# Patient Record
Sex: Female | Born: 1970 | Race: White | Hispanic: No | Marital: Married | Smoking: Former smoker
Health system: Southern US, Community
[De-identification: ages and names within clinical notes are randomized; demographics above are authoritative.]

## PROBLEM LIST (undated history)

## (undated) DIAGNOSIS — T7840XA Allergy, unspecified, initial encounter: Secondary | ICD-10-CM

## (undated) DIAGNOSIS — M1990 Unspecified osteoarthritis, unspecified site: Secondary | ICD-10-CM

## (undated) DIAGNOSIS — F419 Anxiety disorder, unspecified: Secondary | ICD-10-CM

## (undated) DIAGNOSIS — I10 Essential (primary) hypertension: Secondary | ICD-10-CM

## (undated) DIAGNOSIS — K219 Gastro-esophageal reflux disease without esophagitis: Secondary | ICD-10-CM

## (undated) HISTORY — PX: CHOLECYSTECTOMY: SHX55

## (undated) HISTORY — PX: EYE SURGERY: SHX253

## (undated) HISTORY — DX: Essential (primary) hypertension: I10

## (undated) HISTORY — DX: Allergy, unspecified, initial encounter: T78.40XA

## (undated) HISTORY — DX: Unspecified osteoarthritis, unspecified site: M19.90

## (undated) HISTORY — DX: Gastro-esophageal reflux disease without esophagitis: K21.9

## (undated) HISTORY — DX: Anxiety disorder, unspecified: F41.9

## (undated) HISTORY — PX: ABDOMINAL HYSTERECTOMY: SHX81

---

## 2013-12-20 DIAGNOSIS — K219 Gastro-esophageal reflux disease without esophagitis: Secondary | ICD-10-CM | POA: Insufficient documentation

## 2014-02-21 DIAGNOSIS — E782 Mixed hyperlipidemia: Secondary | ICD-10-CM | POA: Insufficient documentation

## 2015-05-08 ENCOUNTER — Encounter: Payer: Self-pay | Admitting: Podiatry

## 2015-05-08 ENCOUNTER — Ambulatory Visit (INDEPENDENT_AMBULATORY_CARE_PROVIDER_SITE_OTHER): Payer: BC Managed Care – PPO | Admitting: Podiatry

## 2015-05-08 ENCOUNTER — Ambulatory Visit (INDEPENDENT_AMBULATORY_CARE_PROVIDER_SITE_OTHER): Payer: BC Managed Care – PPO

## 2015-05-08 VITALS — BP 119/78 | HR 71 | Resp 18

## 2015-05-08 DIAGNOSIS — R52 Pain, unspecified: Secondary | ICD-10-CM | POA: Diagnosis not present

## 2015-05-08 DIAGNOSIS — D3610 Benign neoplasm of peripheral nerves and autonomic nervous system, unspecified: Secondary | ICD-10-CM | POA: Diagnosis not present

## 2015-05-08 NOTE — Progress Notes (Signed)
   Subjective:    Patient ID: Janice Gutierrez, female    DOB: [birth year], 45 y.o.   MRN: [id number]  HPI  45 year old female presents the office they for concerns of pain in between her third and fourth toes on the left foot. She also gets some numbness into into the third and fourth toes. His been ongoing for approximately 10 years it has been progressive. She denies any recent injury or trauma. She said no previous treatment. No other complaints at this time.   Review of Systems  All other systems reviewed and are negative.      Objective:   Physical Exam General: AAO x3, NAD  Dermatological: Skin is warm, dry and supple bilateral. Nails x 10 are well manicured; remaining integument appears unremarkable at this time. There are no open sores, no preulcerative lesions, no rash or signs of infection present.  Vascular: Dorsalis Pedis artery and Posterior Tibial artery pedal pulses are 2/4 bilateral with immedate capillary fill time. Pedal hair growth present. No varicosities and no lower extremity edema present bilateral. There is no pain with calf compression, swelling, warmth, erythema.   Neruologic: Grossly intact via light touch bilateral. Vibratory intact via tuning fork bilateral. Protective threshold with Semmes Wienstein monofilament intact to all pedal sites bilateral. Patellar and Achilles deep tendon reflexes 2+ bilateral. No Babinski or clonus noted bilateral.   Musculoskeletal: Tenderness palpation imaging the third and fourth metatarsal heads on the left foot. There is no palpable neuroma identified this time. There is no area pinpoint bony tenderness or pain the vibratory sensation. No clicking sensation. MMT 5/5.  Gait: Unassisted, Nonantalgic.      Assessment & Plan:  45 year old female left third interspace likely neuroma versus bursitis  -Treatment options discussed including all alternatives, risks, and complications -Etiology of symptoms were discussed -X-rays were  obtained and reviewed with the patient. No evidence of acute fracture or stress fracture.  -After discussion the patient she was to proceed with a steroid injection. Under sterile conditions a mixture of Kenalog, local anesthesia was infiltrated into the area of maximal tenderness. Postinjection care discussed  -Offloading pads dispensed  -Discussed ultrasound the future if symptoms persist.  -Follow-up 4 weeks.   Celesta Gentile, DPM

## 2015-05-12 ENCOUNTER — Telehealth: Payer: Self-pay | Admitting: Podiatry

## 2015-05-12 DIAGNOSIS — D3610 Benign neoplasm of peripheral nerves and autonomic nervous system, unspecified: Secondary | ICD-10-CM

## 2015-05-12 NOTE — Telephone Encounter (Signed)
Please order ultrasound r/o neuroma

## 2015-05-12 NOTE — Telephone Encounter (Signed)
Patient was seen by Dr. Jacqualyn Posey on 05/08/15 received an injection in left foot. She said the injection has helped but she still feels a knot in the bottom of her foot and wants to go ahead and schedule the ultrasound. Please call her to schedule that at [phone] Ext. [phone] before 3:00 today. IF after 3:00 call her at [phone].

## 2015-05-12 NOTE — Telephone Encounter (Signed)
Pt states still has knot in foot, a little better, but would like to schedule [place].

## 2015-05-13 NOTE — Addendum Note (Signed)
Addended by: Rip Harbour on: 05/13/2015 09:34 AM   Modules accepted: Orders

## 2015-05-13 NOTE — Telephone Encounter (Signed)
Patient scheduled for ultrasound at [facility] for April 13th at 3:00. Notified the patient and for her to arrive at 2:45. Also contacted [facility] and no authorization or prior approval is required.

## 2015-05-15 ENCOUNTER — Ambulatory Visit
Admission: RE | Admit: 2015-05-15 | Discharge: 2015-05-15 | Disposition: A | Discharge status: Home / Self Care | Payer: BC Managed Care – PPO | Source: Ambulatory Visit | Attending: Podiatry | Admitting: Podiatry

## 2015-05-15 DIAGNOSIS — Z0389 Encounter for observation for other suspected diseases and conditions ruled out: Secondary | ICD-10-CM | POA: Insufficient documentation

## 2015-05-15 DIAGNOSIS — D3610 Benign neoplasm of peripheral nerves and autonomic nervous system, unspecified: Secondary | ICD-10-CM

## 2015-05-19 ENCOUNTER — Telehealth: Payer: Self-pay | Admitting: *Deleted

## 2015-05-19 NOTE — Telephone Encounter (Addendum)
-----   Message from Trula Slade, DPM sent at 05/18/2015  6:50 PM EDT ----- Please let her know that her ultrasound was negative for neuroma or bursa. Please have her come in for continued treatment if she is still having pain. 05/19/2015-Unable to contact pt the mobile service was busy.  Unable to speak to pt, the phone answered, but there was sound as if someone was on the line, so I left a message to call for results.  Pt called again for results.  I informed pt of Dr. Leigh Aurora results and orders, and transferred pt to schedulers.

## 2015-06-10 ENCOUNTER — Encounter: Payer: Self-pay | Admitting: Podiatry

## 2015-06-10 ENCOUNTER — Ambulatory Visit (INDEPENDENT_AMBULATORY_CARE_PROVIDER_SITE_OTHER): Payer: BC Managed Care – PPO | Admitting: Podiatry

## 2015-06-10 VITALS — BP 117/77 | HR 73 | Resp 18

## 2015-06-10 DIAGNOSIS — M779 Enthesopathy, unspecified: Secondary | ICD-10-CM

## 2015-06-10 DIAGNOSIS — M216X2 Other acquired deformities of left foot: Secondary | ICD-10-CM

## 2015-06-10 DIAGNOSIS — M7742 Metatarsalgia, left foot: Secondary | ICD-10-CM

## 2015-06-10 MED ORDER — MELOXICAM 15 MG PO TABS: 15.0000 mg | ORAL_TABLET | Freq: Every day | ORAL | Status: DC

## 2015-06-10 NOTE — Progress Notes (Signed)
Patient ID: Janice Gutierrez, female   DOB: [birth year], 45 y.o.   MRN: [id number]  Subjective: 45 year old female presents the office to discuss further treatment for her left foot pain. She states the injection did help for couple days after the last appointment have the pain did recur. She also ultrasound previously which was negative for neuroma. She gets occasional numbness to her third fourth toes however not consistently however she does get some discomfort of the bottom of her foot and the ball area. He still has some swelling that she has noticed in the ball of her left foot as well for which she points to submetatarsal 4. No erythema or increase in warmth. Denies any systemic complaints such as fevers, chills, nausea, vomiting. No acute changes since last appointment, and no other complaints at this time.   Objective: AAO x3, NAD DP/PT pulses palpable bilaterally, CRT less than 3 seconds Protective sensation intact with Simms Weinstein monofilament, ROM is the metatarsal heads plantarly with atrophy of the fat pad particular the fourth metatarsal head with localized edema submetatarsal 4. There is no assisted erythema or increase in warmth. Mild tenderness overlying this area. There is no tenderness directly in the interspace at this time. There is no pain with MTPJ range of motion. No pain vibratory sensation. No other areas of tenderness bilaterally. No areas of pinpoint bony tenderness or pain with vibratory sensation. MMT 5/5, ROM WNL. No edema, erythema, increase in warmth to bilateral lower extremities.  No open lesions or pre-ulcerative lesions.  No pain with calf compression, swelling, warmth, erythema  Assessment: Tenderness likely due to metatarsalgia and prominent metatarsal heads and capsulitis.  Plan: -All treatment options discussed with the patient including all alternatives, risks, complications.  -Discussed both conservative and surgical treatment options. I also reviewed the  ultrasound from last appointment which was negative. -I recommended orthotics with an offloading pad around the symptomatic area. She wishes to hold off on CMO and she'll get over-the-counter and dispensed offloading pads. -Prescribed mobic. Discussed side effects of the medication and directed to stop if any are to occur and call the office.  -Ice -Shoegear modifications -Follow-up in 4-6 weeks if symptoms continue or sooner if any problems arise. In the meantime, encouraged to call the office with any questions, concerns, change in symptoms.   Celesta Gentile, DPM

## 2016-08-27 DIAGNOSIS — Z6833 Body mass index (BMI) 33.0-33.9, adult: Secondary | ICD-10-CM | POA: Insufficient documentation

## 2017-05-11 IMAGING — US US EXTREM LOW*L* LIMITED
1 series · 14 of 15 positions shown · non-contrast
Comparison: None

CLINICAL DATA: Neuroma versus bursitis at plantar aspect of LEFT
foot at third interspace

EXAM:
ULTRASOUND RIGHT LOWER EXTREMITY LIMITED
TECHNIQUE: Ultrasound examination of the lower extremity soft tissues was
performed in the area of clinical concern.

[Series 1: us extrem low*left* limited · 0.07mm/px · 14 of 15 slices shown]
[im 1/15]
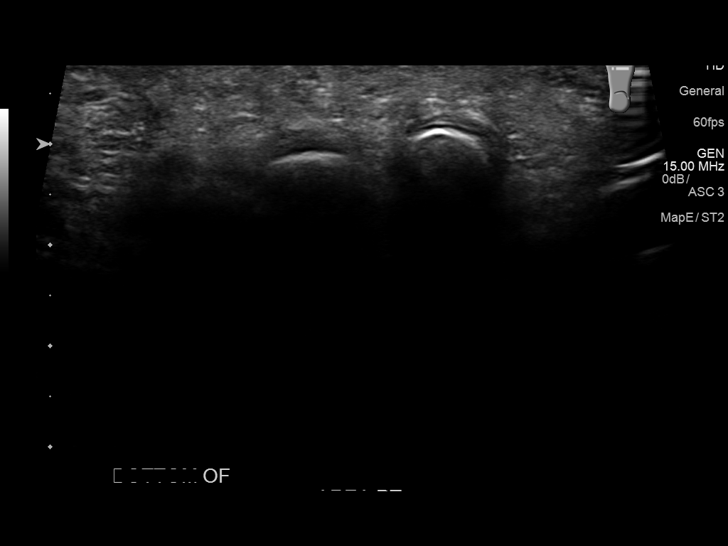
[im 2/15]
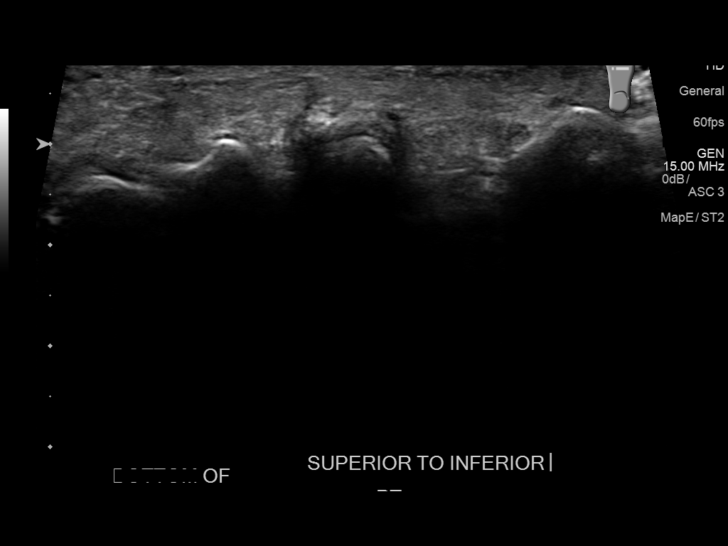
[im 3/15]
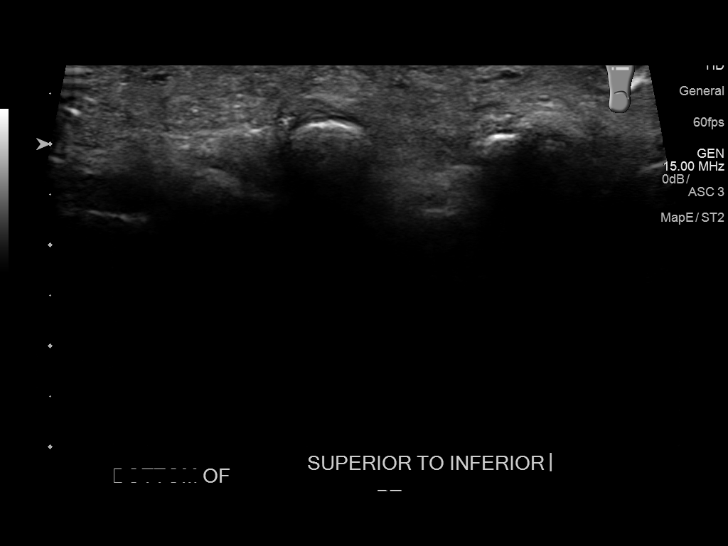
[im 4/15]
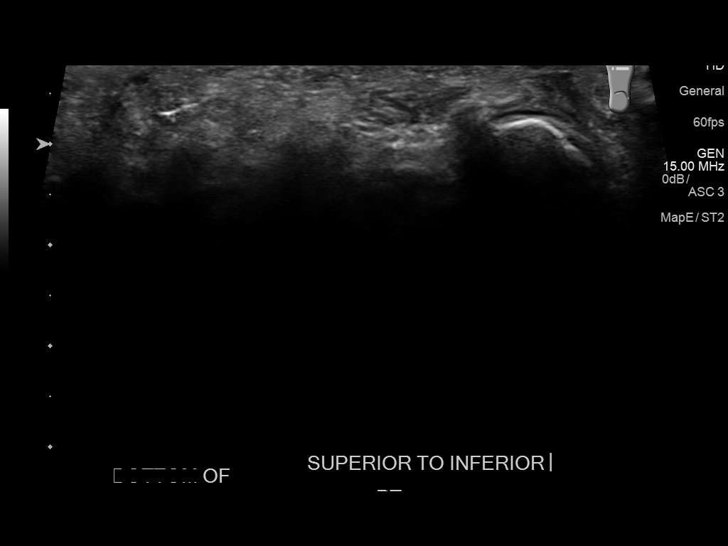
[im 5/15]
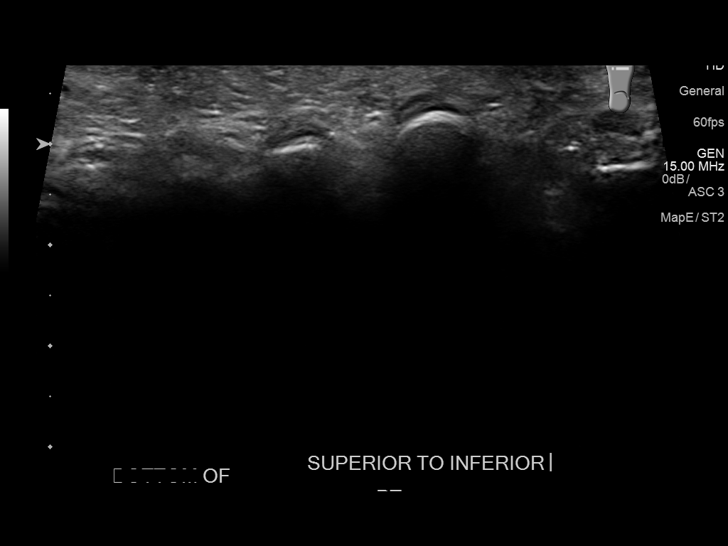
[im 6/15]
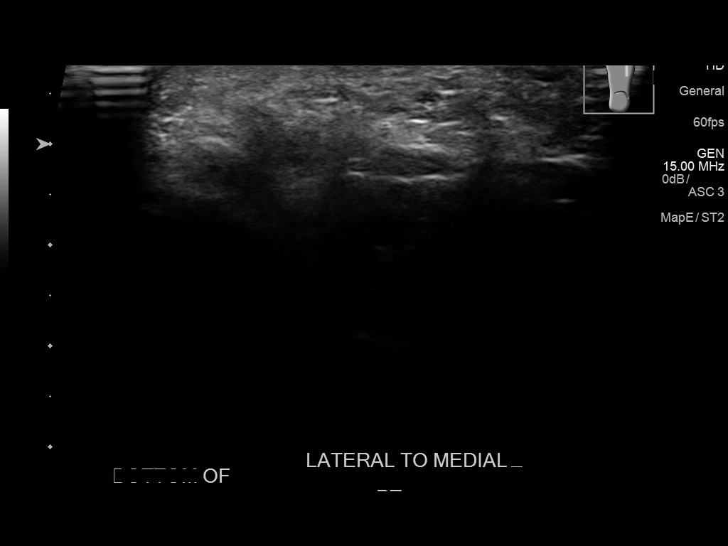
[im 7/15]
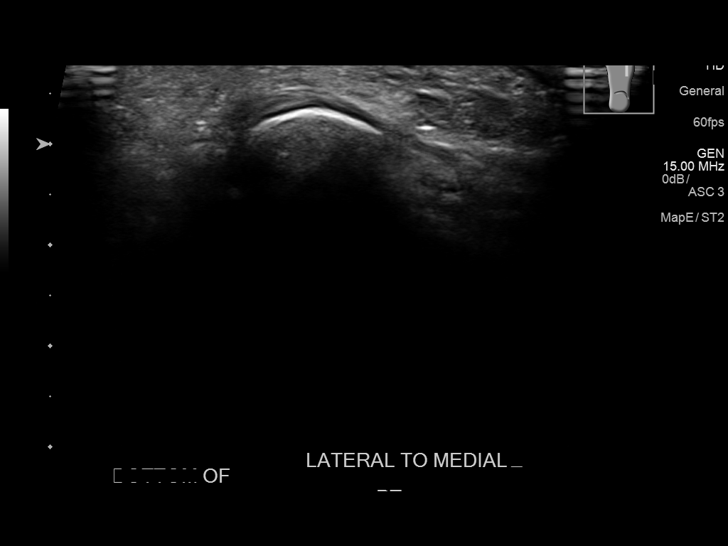
[im 9/15]
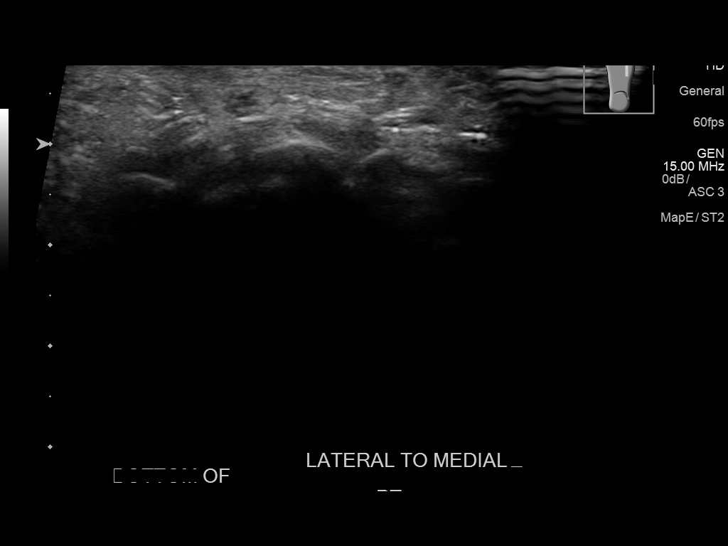
[im 10/15]
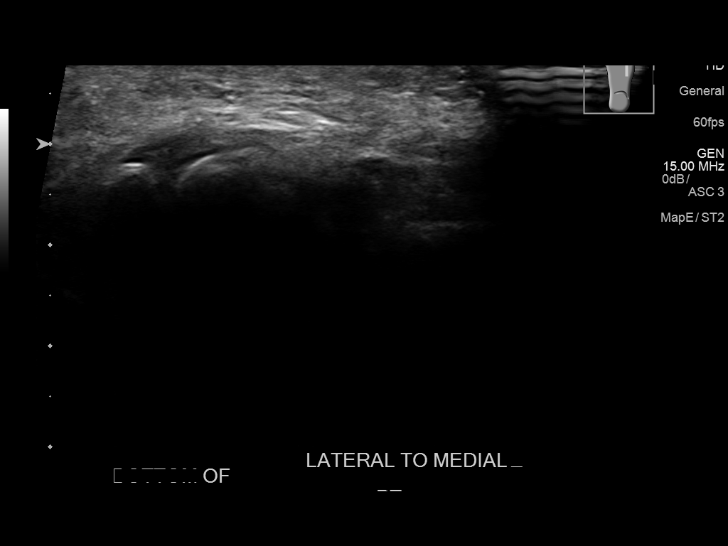
[im 11/15]
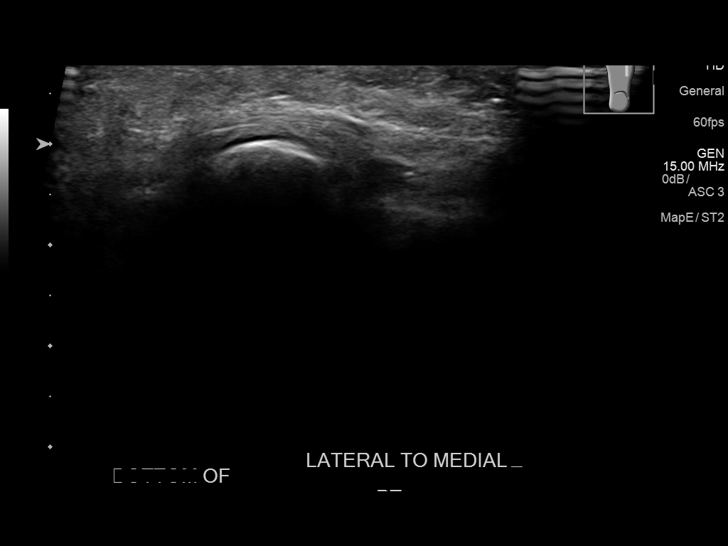
[im 12/15]
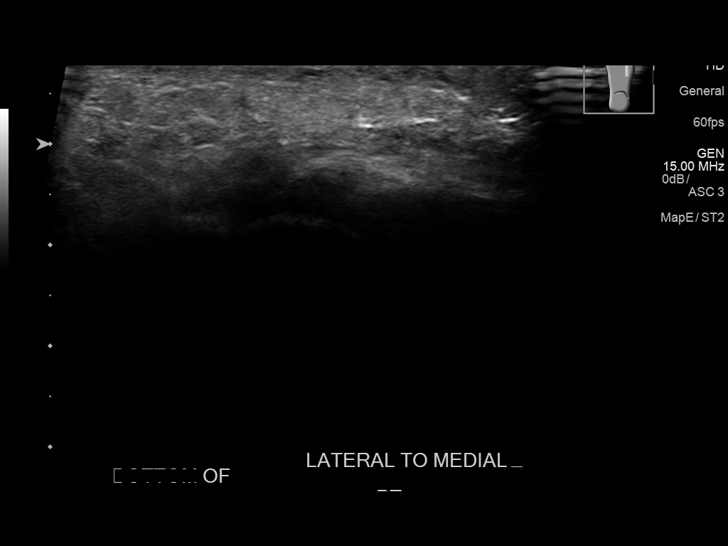
[im 13/15]
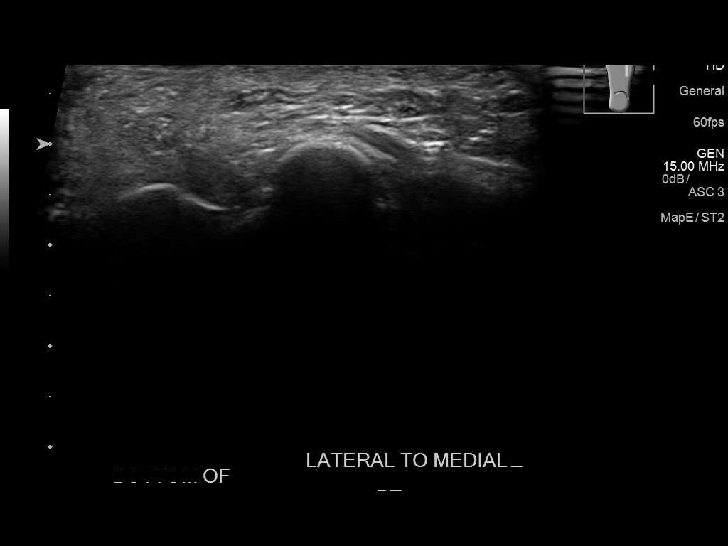
[im 14/15]
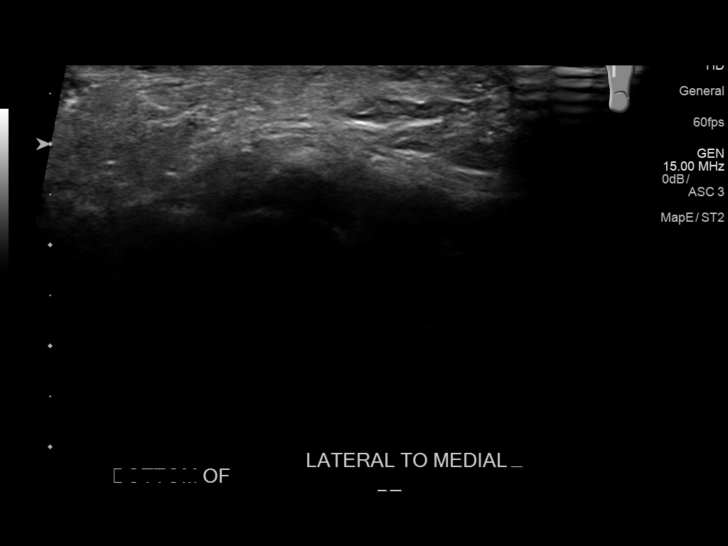
[im 15/15]
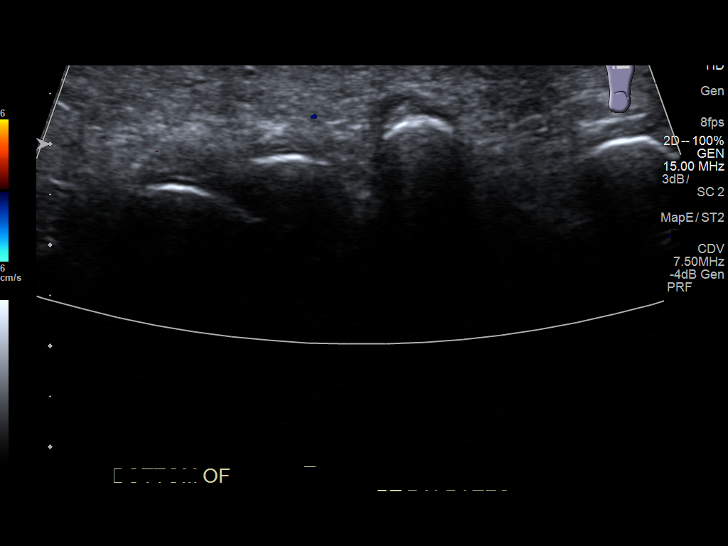

[14 of 15 positions shown; findings below may reference images not displayed]

FINDINGS: Sonography of the site of clinical concern at the plantar aspect of
the LEFT foot was performed.

No soft tissue mass or abnormal fluid collection identified.

No regional soft tissue changes, edema, or color Doppler
hypervascularity.
IMPRESSION: Negative ultrasound at site of clinical concern at the plantar
aspect of LEFT foot.

## 2017-07-08 ENCOUNTER — Ambulatory Visit: Payer: Self-pay | Admitting: Family Medicine

## 2017-08-30 ENCOUNTER — Ambulatory Visit: Payer: BC Managed Care – PPO | Admitting: Family Medicine

## 2017-08-30 ENCOUNTER — Encounter: Payer: Self-pay | Admitting: Family Medicine

## 2017-08-30 VITALS — BP 120/86 | HR 80 | Temp 98.5°F | Resp 16 | Ht 64.0 in | Wt 198.0 lb

## 2017-08-30 DIAGNOSIS — Z8249 Family history of ischemic heart disease and other diseases of the circulatory system: Secondary | ICD-10-CM | POA: Insufficient documentation

## 2017-08-30 DIAGNOSIS — E8881 Metabolic syndrome: Secondary | ICD-10-CM | POA: Diagnosis not present

## 2017-08-30 DIAGNOSIS — Z13220 Encounter for screening for lipoid disorders: Secondary | ICD-10-CM

## 2017-08-30 DIAGNOSIS — R7303 Prediabetes: Secondary | ICD-10-CM | POA: Insufficient documentation

## 2017-08-30 DIAGNOSIS — K2970 Gastritis, unspecified, without bleeding: Secondary | ICD-10-CM

## 2017-08-30 DIAGNOSIS — F419 Anxiety disorder, unspecified: Secondary | ICD-10-CM

## 2017-08-30 DIAGNOSIS — J309 Allergic rhinitis, unspecified: Secondary | ICD-10-CM | POA: Insufficient documentation

## 2017-08-30 DIAGNOSIS — E88819 Insulin resistance, unspecified: Secondary | ICD-10-CM

## 2017-08-30 DIAGNOSIS — M2560 Stiffness of unspecified joint, not elsewhere classified: Secondary | ICD-10-CM

## 2017-08-30 DIAGNOSIS — I10 Essential (primary) hypertension: Secondary | ICD-10-CM | POA: Diagnosis not present

## 2017-08-30 DIAGNOSIS — J301 Allergic rhinitis due to pollen: Secondary | ICD-10-CM | POA: Diagnosis not present

## 2017-08-30 NOTE — Patient Instructions (Signed)

## 2017-08-30 NOTE — Progress Notes (Signed)
Patient: Janice Gutierrez, Female    DOB: [birth year], 47 y.o.   MRN: [id number] Visit Date: 08/30/2017  Today's Provider: Lavon Paganini, MD   I, Martha Clan, CMA, am acting as scribe for Lavon Paganini, MD.  Chief Complaint  Patient presents with  . New Patient (Initial Visit)   Subjective:    New Patient Janice Gutierrez is a 47 y.o. female who presents today as a new patient to establish care. She feels well. She reports exercising some; walks, jump rope, etc. Would like to exercise more. She reports she is sleeping well.  Joints are stiff and sore in the mornings. Especially in hands and feet, and sometimes in hips.  Lasts for a few minutes.  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  She believes she may have some arthritis.  She denies joint pain today.  Patient was told about 10 years ago when she was living in [place] that her insulin level was elevated.  This persisted for about 5 to 7 years.  She was seen by endocrinology who told her this was insulin resistance, but not diabetes or prediabetes.  She states she has had her insulin level checked since moving to [place] and it was in the normal range now.  A1c has always been normal.  She does watch her carb intake and tries to exercise regularly.  HTN: - Medications: Metoprolol 100 mg daily, HCTZ 25 mg daily - Compliance: Good - Checking BP at home: No - Denies any SOB, CP, vision changes, LE edema, medication SEs, or symptoms of hypotension  Patient was diagnosed with gastritis on EGD at [facility] in 2015.  She states she was diagnosed with something else but cannot remember the name.  This is believed to be related to her NSAID use.  She has stopped using NSAIDs much at all and is feeling much better since.  She does take pantoprazole 20 mg daily for symptom relief.  Anxiety: Patient currently taking Lexapro 10 mg daily and  Wellbutrin 37.5 mg daily and she states she is previously on 75 mg of Wellbutrin, but cut back on this because she was feeling a little bit better.  States this is been a chronic issue for many years.  She does not see a psychiatrist or therapist.  She has not tried other medications.  Patient with family history of VTE.  Her father has had multiple blood clots.  Her uncle died of a pulmonary embolism after a knee surgery.  She worries about this and therefore takes a baby aspirin daily.  She has never been tested for clotting disorders and she does not know if her father has.  She has never had a VTE herself.  Patient is status post hysterectomy.  She knows that her ovaries were left, but she is unsure about her cervix.  She is not sure if she still needs to get Pap smears and not sure when her last Pap smear was.  She was seeing a gynecologist in [place].  Allergic rhinitis: Taking Zyrtec as needed, mostly seasonal.  Denies any symptoms currently -----------------------------------------------------------------   Review of Systems  Constitutional: Negative.   HENT: Positive for dental problem, mouth sores, rhinorrhea and sneezing. Negative for congestion, drooling, ear discharge, ear pain, facial swelling, hearing loss, nosebleeds, postnasal drip, sinus pressure, sinus pain, sore throat, tinnitus, trouble swallowing and voice change.  Eyes: Negative.   Respiratory: Negative.   Cardiovascular: Negative.   Gastrointestinal: Negative.   Endocrine: Positive for heat intolerance. Negative for cold intolerance, polydipsia, polyphagia and polyuria.  Genitourinary: Negative.   Musculoskeletal: Positive for back pain and neck stiffness. Negative for arthralgias, gait problem, joint swelling, myalgias and neck pain.  Skin: Positive for rash. Negative for color change, pallor and wound.  Allergic/Immunologic: Negative.   Neurological: Negative.   Hematological: Negative.   Psychiatric/Behavioral:  Negative.     Social History      She  reports that she has never smoked. She has never used smokeless tobacco. She reports that she does not drink alcohol or use drugs.       Social History   Socioeconomic History  . Marital status: Married    Spouse name: Not on file  . Number of children: Not on file  . Years of education: Not on file  . Highest education level: Not on file  Occupational History  . Not on file  Social Needs  . Financial resource strain: Not on file  . Food insecurity:    Worry: Not on file    Inability: Not on file  . Transportation needs:    Medical: Not on file    Non-medical: Not on file  Tobacco Use  . Smoking status: Never Smoker  . Smokeless tobacco: Never Used  Substance and Sexual Activity  . Alcohol use: No    Alcohol/week: 0.0 oz  . Drug use: No  . Sexual activity: Not on file  Lifestyle  . Physical activity:    Days per week: Not on file    Minutes per session: Not on file  . Stress: Not on file  Relationships  . Social connections:    Talks on phone: Not on file    Gets together: Not on file    Attends religious service: Not on file    Active member of club or organization: Not on file    Attends meetings of clubs or organizations: Not on file    Relationship status: Not on file  Other Topics Concern  . Not on file  Social History Narrative  . Not on file    Past Medical History:  Diagnosis Date  . Anxiety   . Arthritis   . Hypertension      Patient Active Problem List   Diagnosis Date Noted  . Prominent metatarsal head of left foot 06/10/2015  . Capsulitis 06/10/2015    Past Surgical History:  Procedure Laterality Date  . ABDOMINAL HYSTERECTOMY    . CHOLECYSTECTOMY    . EYE SURGERY      Family History        Family Status  Relation Name Status  . Mother  (Not Specified)  . Father  (Not Specified)  . Mat Aunt  (Not Specified)  . Mat Uncle  (Not Specified)  . Annamarie Major  (Not Specified)  . MGM  (Not  Specified)  . MGF  (Not Specified)        Her family history includes Bladder Cancer in her maternal grandfather; Clotting disorder in her father and paternal uncle; Diabetes in her maternal uncle; Hyperlipidemia in her maternal aunt; Hypertension in her mother; Skin cancer in her father and maternal grandmother.      Allergies  Allergen Reactions  . Latex      Current Outpatient Medications:  .  Ascorbic Acid (VITAMIN C) 1000 MG tablet, Take by mouth., Disp: , Rfl:  .  aspirin EC 81 MG tablet, Take 1 tablet by mouth daily., Disp: , Rfl:  .  buPROPion (WELLBUTRIN) 75 MG tablet, Take 0.5 tablets by mouth daily., Disp: , Rfl:  .  escitalopram (LEXAPRO) 10 MG tablet, Take 10 mg by mouth daily., Disp: , Rfl: 1 .  hydrochlorothiazide (HYDRODIURIL) 25 MG tablet, , Disp: , Rfl:  .  KRILL OIL PO, Take by mouth., Disp: , Rfl:  .  metoprolol succinate (TOPROL-XL) 100 MG 24 hr tablet, Take by mouth., Disp: , Rfl:  .  Multiple Vitamins-Minerals (MULTIVITAMIN WOMEN) TABS, Take by mouth., Disp: , Rfl:  .  OVER THE COUNTER MEDICATION, Metabolic Advantage Thyroid Health, Disp: , Rfl:  .  pantoprazole (PROTONIX) 20 MG tablet, Take 1 tablet by mouth daily., Disp: , Rfl:    Patient Care Team: Virginia Crews, MD as PCP - General (Family Medicine)      Objective:   Vitals: BP 120/86 (BP Location: Left Arm, Patient Position: Sitting, Cuff Size: Normal)   Pulse 80   Temp 98.5 F (36.9 C) (Oral)   Resp 16   Ht 5\' 4"  (1.626 m)   Wt 198 lb (89.8 kg)   SpO2 97%   BMI 33.99 kg/m    Vitals:   08/30/17 0910  BP: 120/86  Pulse: 80  Resp: 16  Temp: 98.5 F (36.9 C)  TempSrc: Oral  SpO2: 97%  Weight: 198 lb (89.8 kg)  Height: 5\' 4"  (1.626 m)     Physical Exam  Constitutional: She is oriented to person, place, and time. She appears well-developed and well-nourished. No distress.  HENT:  Head: Normocephalic and atraumatic.  Right Ear: External ear normal.  Left Ear: External ear normal.   Nose: Nose normal.  Mouth/Throat: Oropharynx is clear and moist.  Eyes: Pupils are equal, round, and reactive to light. Conjunctivae and EOM are normal. Right eye exhibits no discharge. Left eye exhibits no discharge. No scleral icterus.  Neck: Neck supple. No thyromegaly present.  Cardiovascular: Normal rate, regular rhythm, normal heart sounds and intact distal pulses.  No murmur heard. Pulmonary/Chest: Effort normal and breath sounds normal. No respiratory distress. She has no wheezes. She has no rales.  Abdominal: Soft. Bowel sounds are normal. She exhibits no distension. There is no tenderness. There is no rebound and no guarding.  Musculoskeletal: She exhibits no edema or deformity.  Lymphadenopathy:    She has no cervical adenopathy.  Neurological: She is alert and oriented to person, place, and time. She has normal strength. She displays no atrophy. No cranial nerve deficit or sensory deficit. She exhibits normal muscle tone. Gait normal.  Skin: Skin is warm and dry. Capillary refill takes less than 2 seconds. No rash noted.  Psychiatric: She has a normal mood and affect. Her behavior is normal. Thought content normal.  Vitals reviewed.    Depression Screen PHQ 2/9 Scores 08/30/2017  PHQ - 2 Score 0     Assessment & Plan:    Problem List Items Addressed This Visit      Cardiovascular and Mediastinum   Essential hypertension - Primary    Well-controlled Continue HCTZ and metoprolol at current doses Check CMP Follow-up in 6 months      Relevant Medications   aspirin EC 81 MG tablet   Other Relevant Orders   Comprehensive metabolic panel (Completed)     Respiratory   Allergic rhinitis    Well-controlled Continue Zyrtec as needed        Digestive   Gastritis  Asymptomatic and improving Avoid NSAIDs as she has been Continue PPI therapy We will try to get pathology records from [facility]        Endocrine   Insulin resistance    Advised patient on low-carb  diet to prevent progression to prediabetes or diabetes We will screen at least blood glucose annually No indication to continue checking insulin levels, however      Relevant Orders   Comprehensive metabolic panel (Completed)     Other   Anxiety    Well-controlled at this time Continue Wellbutrin and Lexapro at current doses Follow-up in 6 months Repeat PHQ 9 and GAD 7 at next visit      Relevant Medications   buPROPion (WELLBUTRIN) 75 MG tablet   Other Relevant Orders   CBC (Completed)   Family history of blood clots    Okay to continue baby aspirin daily Discussed signs and symptoms of blood clots and what to do if she thinks she may have 1 Advised her to check with her dad to see if he was ever diagnosed with a clotting disorder and if he has, consider screening her for this      Relevant Orders   CBC (Completed)   Joint stiffness    Advised patient to continue with stretching and regular walking breaks at work Could represent early arthritis Does not seem to last long enough to be pathologic such as a rheumatologic condition Continue to monitor       Other Visit Diagnoses    Screening for lipid disorders       Relevant Orders   Comprehensive metabolic panel (Completed)   Lipid panel (Completed)       Return in about 6 months (around 03/02/2018) for chronic disease f/u.  Will attempt to obtain records from GYN, previous PCP, [facility] and update HCM.   The entirety of the information documented in the History of Present Illness, Review of Systems and Physical Exam were personally obtained by me. Portions of this information were initially documented by Raquel Sarna Ratchford, CMA and reviewed by me for thoroughness and accuracy.    Virginia Crews, MD, MPH [facility] 08/31/2017 8:41 AM

## 2017-08-31 ENCOUNTER — Telehealth: Payer: Self-pay

## 2017-08-31 DIAGNOSIS — M2560 Stiffness of unspecified joint, not elsewhere classified: Secondary | ICD-10-CM | POA: Insufficient documentation

## 2017-08-31 DIAGNOSIS — K2970 Gastritis, unspecified, without bleeding: Secondary | ICD-10-CM | POA: Insufficient documentation

## 2017-08-31 LAB — LIPID PANEL
CHOL/HDL RATIO: 4.3 ratio (ref 0.0–4.4)
Cholesterol, Total: 200 mg/dL — ABNORMAL HIGH (ref 100–199)
HDL: 46 mg/dL (ref >39)
LDL CALC: 126 mg/dL — AB (ref 0–99)
Triglycerides: 139 mg/dL (ref 0–149)
VLDL CHOLESTEROL CAL: 28 mg/dL (ref 5–40)

## 2017-08-31 LAB — COMPREHENSIVE METABOLIC PANEL
ALBUMIN: 4.2 g/dL (ref 3.5–5.5)
ALT: 17 IU/L (ref 0–32)
AST: 18 IU/L (ref 0–40)
Albumin/Globulin Ratio: 1.6 (ref 1.2–2.2)
Alkaline Phosphatase: 82 IU/L (ref 39–117)
BILIRUBIN TOTAL: 0.3 mg/dL (ref 0.0–1.2)
BUN / CREAT RATIO: 22 (ref 9–23)
BUN: 14 mg/dL (ref 6–24)
CHLORIDE: 102 mmol/L (ref 96–106)
CO2: 23 mmol/L (ref 20–29)
CREATININE: 0.64 mg/dL (ref 0.57–1.00)
Calcium: 9.4 mg/dL (ref 8.7–10.2)
GFR, EST AFRICAN AMERICAN: 124 mL/min/{1.73_m2} (ref >59)
GFR, EST NON AFRICAN AMERICAN: 107 mL/min/{1.73_m2} (ref >59)
GLUCOSE: 85 mg/dL (ref 65–99)
Globulin, Total: 2.7 g/dL (ref 1.5–4.5)
Potassium: 4.7 mmol/L (ref 3.5–5.2)
Sodium: 140 mmol/L (ref 134–144)
TOTAL PROTEIN: 6.9 g/dL (ref 6.0–8.5)

## 2017-08-31 LAB — CBC
HEMOGLOBIN: 12.7 g/dL (ref 11.1–15.9)
Hematocrit: 37.9 % (ref 34.0–46.6)
MCH: 29.4 pg (ref 26.6–33.0)
MCHC: 33.5 g/dL (ref 31.5–35.7)
MCV: 88 fL (ref 79–97)
Platelets: 260 10*3/uL (ref 150–450)
RBC: 4.32 x10E6/uL (ref 3.77–5.28)
RDW: 12.4 % (ref 12.3–15.4)
WBC: 5.8 10*3/uL (ref 3.4–10.8)

## 2017-08-31 NOTE — Assessment & Plan Note (Signed)
Advised patient on low-carb diet to prevent progression to prediabetes or diabetes We will screen at least blood glucose annually No indication to continue checking insulin levels, however

## 2017-08-31 NOTE — Assessment & Plan Note (Signed)
Okay to continue baby aspirin daily Discussed signs and symptoms of blood clots and what to do if she thinks she may have 1 Advised her to check with her dad to see if he was ever diagnosed with a clotting disorder and if he has, consider screening her for this

## 2017-08-31 NOTE — Assessment & Plan Note (Signed)
Well-controlled Continue HCTZ and metoprolol at current doses Check CMP Follow-up in 6 months

## 2017-08-31 NOTE — Assessment & Plan Note (Signed)
Asymptomatic and improving Avoid NSAIDs as she has been Continue PPI therapy We will try to get pathology records from [place]

## 2017-08-31 NOTE — Telephone Encounter (Signed)
Pt advised.

## 2017-08-31 NOTE — Telephone Encounter (Signed)
-----   Message from Virginia Crews, MD sent at 08/31/2017 10:26 AM EDT ----- Normal blood counts, kidney function, liver function, electrolytes, blood sugar.  Cholesterol is high, but 10 year risk of heart disease/stroke is low at 1.4%.   Virginia Crews, MD, MPH [facility] 08/31/2017 10:26 AM

## 2017-08-31 NOTE — Assessment & Plan Note (Signed)
Well-controlled Continue Zyrtec as needed

## 2017-08-31 NOTE — Assessment & Plan Note (Signed)
Well-controlled at this time Continue Wellbutrin and Lexapro at current doses Follow-up in 6 months Repeat PHQ 9 and GAD 7 at next visit

## 2017-08-31 NOTE — Assessment & Plan Note (Signed)
Advised patient to continue with stretching and regular walking breaks at work Could represent early arthritis Does not seem to last long enough to be pathologic such as a rheumatologic condition Continue to monitor

## 2017-10-10 ENCOUNTER — Other Ambulatory Visit: Payer: Self-pay | Admitting: Family Medicine

## 2017-10-10 MED ORDER — METOPROLOL SUCCINATE ER 100 MG PO TB24: 100.0000 mg | ORAL_TABLET | Freq: Every day | ORAL | 11 refills | Status: DC

## 2017-10-10 MED ORDER — PANTOPRAZOLE SODIUM 20 MG PO TBEC: 20.0000 mg | DELAYED_RELEASE_TABLET | Freq: Every day | ORAL | 11 refills | Status: DC

## 2017-10-10 MED ORDER — ESCITALOPRAM OXALATE 10 MG PO TABS: 10.0000 mg | ORAL_TABLET | Freq: Every day | ORAL | 5 refills | Status: DC

## 2017-10-10 MED ORDER — BUPROPION HCL 75 MG PO TABS: 37.5000 mg | ORAL_TABLET | Freq: Every day | ORAL | 5 refills | Status: DC

## 2017-10-10 MED ORDER — HYDROCHLOROTHIAZIDE 25 MG PO TABS: 25.0000 mg | ORAL_TABLET | Freq: Every day | ORAL | 5 refills | Status: DC

## 2017-10-10 NOTE — Telephone Encounter (Signed)
Pt needs refills on  Metoprolol 100mg   Pantoprazole 20 mg  escitalopram 10 mg  Hydrodiuril 25 mg generic   Bupropion 75 mg generic  She uses [facility]  Pt's CB# [phone]  Thanks  Con Memos

## 2017-11-01 ENCOUNTER — Other Ambulatory Visit: Payer: Self-pay | Admitting: Family Medicine

## 2017-12-01 LAB — HM MAMMOGRAPHY

## 2018-01-31 ENCOUNTER — Other Ambulatory Visit: Payer: Self-pay | Admitting: Physician Assistant

## 2018-03-02 ENCOUNTER — Ambulatory Visit: Payer: Self-pay | Admitting: Family Medicine

## 2018-03-16 ENCOUNTER — Ambulatory Visit: Payer: Self-pay | Admitting: Family Medicine

## 2018-05-24 ENCOUNTER — Encounter: Payer: Self-pay | Admitting: Family Medicine

## 2018-05-24 ENCOUNTER — Ambulatory Visit (INDEPENDENT_AMBULATORY_CARE_PROVIDER_SITE_OTHER): Payer: BC Managed Care – PPO | Admitting: Family Medicine

## 2018-05-24 VITALS — BP 124/87 | HR 76

## 2018-05-24 DIAGNOSIS — M19049 Primary osteoarthritis, unspecified hand: Secondary | ICD-10-CM

## 2018-05-24 DIAGNOSIS — K219 Gastro-esophageal reflux disease without esophagitis: Secondary | ICD-10-CM | POA: Diagnosis not present

## 2018-05-24 DIAGNOSIS — E039 Hypothyroidism, unspecified: Secondary | ICD-10-CM | POA: Insufficient documentation

## 2018-05-24 DIAGNOSIS — K2970 Gastritis, unspecified, without bleeding: Secondary | ICD-10-CM

## 2018-05-24 DIAGNOSIS — F411 Generalized anxiety disorder: Secondary | ICD-10-CM

## 2018-05-24 DIAGNOSIS — I10 Essential (primary) hypertension: Secondary | ICD-10-CM | POA: Diagnosis not present

## 2018-05-24 DIAGNOSIS — N6019 Diffuse cystic mastopathy of unspecified breast: Secondary | ICD-10-CM | POA: Insufficient documentation

## 2018-05-24 MED ORDER — METOPROLOL SUCCINATE ER 100 MG PO TB24: 100.0000 mg | ORAL_TABLET | Freq: Every day | ORAL | 3 refills | Status: DC

## 2018-05-24 MED ORDER — BUPROPION HCL 75 MG PO TABS: ORAL_TABLET | ORAL | 3 refills | Status: DC

## 2018-05-24 MED ORDER — PANTOPRAZOLE SODIUM 20 MG PO TBEC: 20.0000 mg | DELAYED_RELEASE_TABLET | Freq: Every day | ORAL | 3 refills | Status: DC

## 2018-05-24 MED ORDER — HYDROCHLOROTHIAZIDE 25 MG PO TABS: 25.0000 mg | ORAL_TABLET | Freq: Every day | ORAL | 3 refills | Status: DC

## 2018-05-24 MED ORDER — ESCITALOPRAM OXALATE 10 MG PO TABS: 10.0000 mg | ORAL_TABLET | Freq: Every day | ORAL | 3 refills | Status: DC

## 2018-05-24 NOTE — Assessment & Plan Note (Signed)
Well controlled  continue HCTZ and metoprolol Recheck CMP at next visit F/u in 3-6 months for CPE

## 2018-05-24 NOTE — Progress Notes (Addendum)
Patient: Janice Gutierrez Female    DOB: 1970-07-03   48 y.o.   MRN: [id number] Visit Date: 05/24/2018  Today's Provider: Lavon Paganini, MD   Chief Complaint  Patient presents with  . Hypertension  . Anxiety   Subjective:    Virtual Visit via Video Note  I connected with [name] on 05/24/18 at  9:20 AM EDT by a video enabled telemedicine application and verified that I am speaking with the correct person using two identifiers.   I discussed the limitations of evaluation and management by telemedicine and the availability of in person appointments. The patient expressed understanding and agreed to proceed.  Patient location: home Provider location: [facility] Persons involved in the visit: patient, provider  HPI  Hypertension, follow-up:  BP Readings from Last 3 Encounters:  05/24/18 124/87  08/30/17 120/86  06/10/15 117/77    She was last seen for hypertension 6 months ago.  BP at that visit was 120/86. Management changes since that visit include advised to continue medications. She reports good compliance with treatment. She is not having side effects.  She is exercising. She is adherent to low salt diet.   Outside blood pressures are not being checked at home unless having symptoms of HTN. She is experiencing none.  Patient denies chest pain, chest pressure/discomfort, claudication, dyspnea, exertional chest pressure/discomfort, fatigue, irregular heart beat, lower extremity edema, near-syncope, orthopnea, palpitations, paroxysmal nocturnal dyspnea, syncope and tachypnea.   Cardiovascular risk factors include dyslipidemia and hypertension.  Use of agents associated with hypertension: none.     Weight trend: stable Wt Readings from Last 3 Encounters:  08/30/17 198 lb (89.8 kg)    Current diet: well balanced  ------------------------------------------------------------------------  Intermittent hand pain Has some swelling and pain  in joints of fingers GYN told her to have PCP look into testing for RA and lupus Foot stiffness after sitting for a long time Working out 2-3 times weekly with daughter  Last PCP tested for RF and ANA in 12/2014. ANA positive but confirmatory tests negative for Anti-SM, anti-RNP, anti-RO, anti-LA.   Anxiety: Patient presents for a 6 month follow up. Last OV was on 08/30/2017. Patient advised to continue Wellbutrin and Lexapro at current dose. Patient reports good compliance to treatment plan. She states symptoms are stable.  Has not needed Xanax in >6 months.  GAD 7 : Generalized Anxiety Score 05/24/2018  Nervous, Anxious, on Edge 0  Control/stop worrying 0  Worry too much - different things 0  Trouble relaxing 0  Restless 0  Easily annoyed or irritable 0  Afraid - awful might happen 0  Total GAD 7 Score 0  Anxiety Difficulty Not difficult at all     Depression screen [facility] 2/9 05/24/2018 08/30/2017  Decreased Interest 0 0  Down, Depressed, Hopeless 0 0  PHQ - 2 Score 0 0  Altered sleeping 0 -  Tired, decreased energy 0 -  Change in appetite 0 -  Feeling bad or failure about yourself  0 -  Trouble concentrating 0 -  Moving slowly or fidgety/restless 0 -  Suicidal thoughts 0 -  PHQ-9 Score 0 -  Difficult doing work/chores Not difficult at all -     Allergies  Allergen Reactions  . Latex      Current Outpatient Medications:  .  Ascorbic Acid (VITAMIN C) 1000 MG tablet, Take by mouth., Disp: , Rfl:  .  aspirin EC 81 MG tablet, Take 1 tablet by mouth daily.,  Disp: , Rfl:  .  buPROPion (WELLBUTRIN) 75 MG tablet, TAKE 1/2 TABLETS (37.5 MG TOTAL) BY MOUTH DAILY., Disp: 45 tablet, Rfl: 3 .  cetirizine (ZYRTEC) 10 MG tablet, Take 10 mg by mouth daily., Disp: , Rfl:  .  escitalopram (LEXAPRO) 10 MG tablet, Take 1 tablet (10 mg total) by mouth daily., Disp: 90 tablet, Rfl: 3 .  hydrochlorothiazide (HYDRODIURIL) 25 MG tablet, Take 1 tablet (25 mg total) by mouth daily., Disp: 90  tablet, Rfl: 3 .  KRILL OIL PO, Take by mouth., Disp: , Rfl:  .  metoprolol succinate (TOPROL-XL) 100 MG 24 hr tablet, Take 1 tablet (100 mg total) by mouth daily., Disp: 90 tablet, Rfl: 3 .  Multiple Vitamins-Minerals (MULTIVITAMIN WOMEN) TABS, Take by mouth., Disp: , Rfl:  .  OVER THE COUNTER MEDICATION, Metabolic Advantage Thyroid Health, Disp: , Rfl:  .  pantoprazole (PROTONIX) 20 MG tablet, Take 1 tablet (20 mg total) by mouth daily., Disp: 90 tablet, Rfl: 3  Review of Systems  Constitutional: Negative.   Respiratory: Negative.   Cardiovascular: Negative.   Musculoskeletal: Negative.   Psychiatric/Behavioral: The patient is nervous/anxious.     Social History   Tobacco Use  . Smoking status: Former Smoker    Packs/day: 1.00    Years: 10.00    Pack years: 10.00    Types: Cigarettes    Last attempt to quit: 02/01/2008    Years since quitting: 10.3  . Smokeless tobacco: Never Used  Substance Use Topics  . Alcohol use: No    Alcohol/week: 0.0 standard drinks      Objective:   BP 124/87   Pulse 76  Vitals:   05/24/18 0933  BP: 124/87  Pulse: 76     Physical Exam Constitutional:      Appearance: Normal appearance.  Pulmonary:     Effort: Pulmonary effort is normal. No respiratory distress.  Neurological:     Mental Status: She is alert and oriented to person, place, and time. Mental status is at baseline.  Psychiatric:        Mood and Affect: Mood normal.        Behavior: Behavior normal.        Thought Content: Thought content normal.         Assessment & Plan    I discussed the assessment and treatment plan with the patient. The patient was provided an opportunity to ask questions and all were answered. The patient agreed with the plan and demonstrated an understanding of the instructions.   The patient was advised to call back or seek an in-person evaluation if the symptoms worsen or if the condition fails to improve as anticipated.  Problem List Items  Addressed This Visit      Cardiovascular and Mediastinum   Essential hypertension - Primary    Well controlled  continue HCTZ and metoprolol Recheck CMP at next visit F/u in 3-6 months for CPE      Relevant Medications   hydrochlorothiazide (HYDRODIURIL) 25 MG tablet   metoprolol succinate (TOPROL-XL) 100 MG 24 hr tablet     Digestive   Gastritis    Stable and well controlled Continue PPI No path records from [facility] recieved       Gastroesophageal reflux disease without esophagitis    Continue PPI as above      Relevant Medications   pantoprazole (PROTONIX) 20 MG tablet     Musculoskeletal and Integument   Hand arthritis    Chronic and  stable Does have some stiffness and swelling of joints of fingers Will plan on autoimmune labs and possible XRay at next in person        Other   GAD (generalized anxiety disorder)    Stable and well controlled  Chronic Continue wellbutrin and lexapro at current doses F/u in 9m and repeat GAD7 and PHQ9      Relevant Medications   buPROPion (WELLBUTRIN) 75 MG tablet   escitalopram (LEXAPRO) 10 MG tablet       Return in about 3 months (around 08/23/2018) for CPE.   The entirety of the information documented in the History of Present Illness, Review of Systems and Physical Exam were personally obtained by me. Portions of this information were initially documented by Tiburcio Pea, CMA and reviewed by me for thoroughness and accuracy.    Virginia Crews, MD, MPH [facility] 05/24/2018 10:39 AM

## 2018-05-24 NOTE — Assessment & Plan Note (Signed)
Continue PPI as above

## 2018-05-24 NOTE — Assessment & Plan Note (Signed)
Stable and well controlled Continue PPI No path records from [place] recieved

## 2018-05-24 NOTE — Assessment & Plan Note (Signed)
Chronic and stable Does have some stiffness and swelling of joints of fingers Will plan on autoimmune labs and possible XRay at next in person

## 2018-05-24 NOTE — Assessment & Plan Note (Signed)
Stable and well controlled  Chronic Continue wellbutrin and lexapro at current doses F/u in 28m and repeat GAD7 and PHQ9

## 2018-05-31 NOTE — Progress Notes (Signed)
ROI faxed to Dr. Jackie Plum at [phone]. Received notification that patient does not have and pap results on file.

## 2018-10-27 ENCOUNTER — Ambulatory Visit (INDEPENDENT_AMBULATORY_CARE_PROVIDER_SITE_OTHER): Payer: BC Managed Care – PPO | Admitting: Family Medicine

## 2018-10-27 ENCOUNTER — Ambulatory Visit
Admission: RE | Admit: 2018-10-27 | Discharge: 2018-10-27 | Disposition: A | Discharge status: Home / Self Care | Payer: BC Managed Care – PPO | Attending: Family Medicine | Admitting: Family Medicine

## 2018-10-27 ENCOUNTER — Encounter: Payer: Self-pay | Admitting: Family Medicine

## 2018-10-27 ENCOUNTER — Other Ambulatory Visit: Payer: Self-pay

## 2018-10-27 ENCOUNTER — Ambulatory Visit
Admission: RE | Admit: 2018-10-27 | Discharge: 2018-10-27 | Disposition: A | Discharge status: Home / Self Care | Payer: BC Managed Care – PPO | Source: Ambulatory Visit | Attending: Family Medicine | Admitting: Family Medicine

## 2018-10-27 VITALS — BP 122/77 | HR 65 | Temp 96.9°F | Ht 63.5 in | Wt 197.6 lb

## 2018-10-27 DIAGNOSIS — M19049 Primary osteoarthritis, unspecified hand: Secondary | ICD-10-CM | POA: Insufficient documentation

## 2018-10-27 DIAGNOSIS — I10 Essential (primary) hypertension: Secondary | ICD-10-CM | POA: Diagnosis not present

## 2018-10-27 DIAGNOSIS — E782 Mixed hyperlipidemia: Secondary | ICD-10-CM | POA: Diagnosis not present

## 2018-10-27 DIAGNOSIS — E8881 Metabolic syndrome: Secondary | ICD-10-CM

## 2018-10-27 DIAGNOSIS — F411 Generalized anxiety disorder: Secondary | ICD-10-CM

## 2018-10-27 DIAGNOSIS — K219 Gastro-esophageal reflux disease without esophagitis: Secondary | ICD-10-CM

## 2018-10-27 DIAGNOSIS — Z0000 Encounter for general adult medical examination without abnormal findings: Secondary | ICD-10-CM | POA: Diagnosis not present

## 2018-10-27 NOTE — Assessment & Plan Note (Signed)
Advised on low carb Recheck A1c

## 2018-10-27 NOTE — Assessment & Plan Note (Signed)
Continue PPI ?

## 2018-10-27 NOTE — Assessment & Plan Note (Addendum)
Chronic and stable She has previously been evaluated by former PCP and have positive ANA, but negative follow-up testing She also had a negative rheumatoid factor She has never seen a rheumatologist Given her stiffness, swelling, and that pain is primarily in her DIPs, as well as this is associated with some nail pitting and possible psoriatic rash, concern for possible psoriatic arthritis or other rheumatologic arthritis We will get some initial labs and refer to rheumatology Will obtain bilateral hand x-rays as well

## 2018-10-27 NOTE — Patient Instructions (Signed)

## 2018-10-27 NOTE — Assessment & Plan Note (Signed)
Chronic, well controlled Continue Wellbutrin and Lexapro at current doses Follow-up in 6 months and repeat GAD-7 and PHQ-9

## 2018-10-27 NOTE — Progress Notes (Signed)
Patient: Janice Gutierrez, Female    DOB: 1970-05-07, 48 y.o.   MRN: [id number] Visit Date: 10/27/2018  Today's Provider: Lavon Paganini, MD   Chief Complaint  Patient presents with  . Annual Exam   Subjective:    I, Tiburcio Pea, CMA, am acting as a scribe for Lavon Paganini, MD.    Annual physical exam Janice Gutierrez is a 48 y.o. female who presents today for health maintenance and complete physical. She feels well. She reports exercising 3 days per week. She reports she is sleeping well. -----------------------------------------------------------------  Patient has struggled with bilateral hand pain for many years.  Her pain is primarily in her DIP joints.  She experiences significant morning stiffness and stiffness throughout the day if she is sitting in 1 place for long periods of time.  She also has some low back pain, foot pain intermittently.  She has a dry and scaling rash intermittently on her elbows.  She will experience some joint swelling in her hands intermittently.   Review of Systems  Constitutional: Negative.   HENT: Negative.   Eyes: Negative.   Respiratory: Negative.   Cardiovascular: Negative.   Gastrointestinal: Negative.   Endocrine: Negative.   Genitourinary: Negative.   Musculoskeletal: Positive for arthralgias, back pain, joint swelling, neck pain and neck stiffness. Negative for gait problem and myalgias.  Skin: Negative.   Allergic/Immunologic: Negative.   Neurological: Negative.   Hematological: Negative.   Psychiatric/Behavioral: Negative.     Social History      She  reports that she quit smoking about 10 years ago. Her smoking use included cigarettes. She has a 10.00 pack-year smoking history. She has never used smokeless tobacco. She reports that she does not drink alcohol or use drugs.       Social History   Socioeconomic History  . Marital status: Married    Spouse name: Aaron Edelman  . Number of children: 1  . Years of education: 65   . Highest education level: Associate degree: academic program  Occupational History  . Occupation: Environmental health practitioner: [employer]  . Financial resource strain: Not on file  . Food insecurity    Worry: Not on file    Inability: Not on file  . Transportation needs    Medical: Not on file    Non-medical: Not on file  Tobacco Use  . Smoking status: Former Smoker    Packs/day: 1.00    Years: 10.00    Pack years: 10.00    Types: Cigarettes    Quit date: 02/01/2008    Years since quitting: 10.7  . Smokeless tobacco: Never Used  Substance and Sexual Activity  . Alcohol use: No    Alcohol/week: 0.0 standard drinks  . Drug use: No  . Sexual activity: Yes    Partners: Male    Birth control/protection: Surgical, Condom  Lifestyle  . Physical activity    Days per week: Not on file    Minutes per session: Not on file  . Stress: Not on file  Relationships  . Social Herbalist on phone: Not on file    Gets together: Not on file    Attends religious service: Not on file    Active member of club or organization: Not on file    Attends meetings of clubs or organizations: Not on file    Relationship status: Not on file  Other Topics Concern  . Not on file  Social History  Narrative  . Not on file    Past Medical History:  Diagnosis Date  . Anxiety   . Arthritis   . Hypertension      Patient Active Problem List   Diagnosis Date Noted  . Hypothyroidism 05/24/2018  . Fibrocystic disease of breast 05/24/2018  . GAD (generalized anxiety disorder) 05/24/2018  . Hand arthritis 05/24/2018  . Gastritis 08/31/2017  . Joint stiffness 08/31/2017  . Insulin resistance 08/30/2017  . Essential hypertension 08/30/2017  . Allergic rhinitis 08/30/2017  . Family history of blood clots 08/30/2017  . Prominent metatarsal head of left foot 06/10/2015  . Mixed hyperlipidemia 02/21/2014  . Gastroesophageal reflux disease without esophagitis 12/20/2013    Past  Surgical History:  Procedure Laterality Date  . ABDOMINAL HYSTERECTOMY     knows that ovaries are still present.  Unsure if cervix remains  . CHOLECYSTECTOMY    . EYE SURGERY      Family History        Family Status  Relation Name Status  . Mother  (Not Specified)  . Father  (Not Specified)  . Mat Aunt  (Not Specified)  . Mat Uncle  (Not Specified)  . Annamarie Major  (Not Specified)  . MGM  (Not Specified)  . MGF  (Not Specified)  . Neg Hx  (Not Specified)        Her family history includes Bladder Cancer in her maternal grandfather; Clotting disorder in her father and paternal uncle; Diabetes in her maternal uncle; Hyperlipidemia in her maternal aunt; Hypertension in her mother; Skin cancer in her father and maternal grandmother. There is no history of Colon cancer, Breast cancer, Ovarian cancer, or Cervical cancer.      Allergies  Allergen Reactions  . Latex      Current Outpatient Medications:  .  aspirin EC 81 MG tablet, Take 1 tablet by mouth daily., Disp: , Rfl:  .  buPROPion (WELLBUTRIN) 75 MG tablet, TAKE 1/2 TABLETS (37.5 MG TOTAL) BY MOUTH DAILY., Disp: 45 tablet, Rfl: 3 .  escitalopram (LEXAPRO) 10 MG tablet, Take 1 tablet (10 mg total) by mouth daily., Disp: 90 tablet, Rfl: 3 .  hydrochlorothiazide (HYDRODIURIL) 25 MG tablet, Take 1 tablet (25 mg total) by mouth daily., Disp: 90 tablet, Rfl: 3 .  KRILL OIL PO, Take by mouth., Disp: , Rfl:  .  metoprolol succinate (TOPROL-XL) 100 MG 24 hr tablet, Take 1 tablet (100 mg total) by mouth daily., Disp: 90 tablet, Rfl: 3 .  Multiple Vitamins-Minerals (MULTIVITAMIN WOMEN) TABS, Take by mouth., Disp: , Rfl:  .  pantoprazole (PROTONIX) 20 MG tablet, Take 1 tablet (20 mg total) by mouth daily., Disp: 90 tablet, Rfl: 3 .  cetirizine (ZYRTEC) 10 MG tablet, Take 10 mg by mouth daily., Disp: , Rfl:    Patient Care Team: Virginia Crews, MD as PCP - General (Family Medicine)    Objective:    Vitals: BP 122/77 (BP Location:  Right Arm, Patient Position: Sitting, Cuff Size: Normal)   Pulse 65   Temp (!) 96.9 F (36.1 C) (Temporal)   Ht 5' 3.5" (1.613 m)   Wt 197 lb 9.6 oz (89.6 kg)   SpO2 97%   BMI 34.45 kg/m    Vitals:   10/27/18 0847  BP: 122/77  Pulse: 65  Temp: (!) 96.9 F (36.1 C)  TempSrc: Temporal  SpO2: 97%  Weight: 197 lb 9.6 oz (89.6 kg)  Height: 5' 3.5" (1.613 m)     Physical Exam Vitals  signs reviewed.  Constitutional:      General: She is not in acute distress.    Appearance: Normal appearance. She is well-developed. She is not diaphoretic.  HENT:     Head: Normocephalic and atraumatic.     Right Ear: Tympanic membrane, ear canal and external ear normal.     Left Ear: Tympanic membrane, ear canal and external ear normal.  Eyes:     General: No scleral icterus.    Conjunctiva/sclera: Conjunctivae normal.     Pupils: Pupils are equal, round, and reactive to light.  Neck:     Musculoskeletal: Neck supple.     Thyroid: No thyromegaly.  Cardiovascular:     Rate and Rhythm: Normal rate and regular rhythm.     Pulses: Normal pulses.     Heart sounds: Normal heart sounds. No murmur.  Pulmonary:     Effort: Pulmonary effort is normal. No respiratory distress.     Breath sounds: Normal breath sounds. No wheezing or rales.  Abdominal:     General: There is no distension.     Palpations: Abdomen is soft.     Tenderness: There is no abdominal tenderness.  Musculoskeletal:     Right lower leg: No edema.     Left lower leg: No edema.     Comments: Bilateral hands: Enlarged DIP joints in bilateral hands, radial deviation of bilateral 5th fingers. Slight nail pitting. ROM intact.   Lymphadenopathy:     Cervical: No cervical adenopathy.  Skin:    General: Skin is warm and dry.     Capillary Refill: Capillary refill takes less than 2 seconds.     Findings: No rash.  Neurological:     Mental Status: She is alert and oriented to person, place, and time. Mental status is at baseline.      Sensory: No sensory deficit.     Motor: No weakness.     Gait: Gait normal.  Psychiatric:        Mood and Affect: Mood normal.        Behavior: Behavior normal.        Thought Content: Thought content normal.       Depression Screen PHQ 2/9 Scores 05/24/2018 08/30/2017  PHQ - 2 Score 0 0  PHQ- 9 Score 0 -      Assessment & Plan:     Routine Health Maintenance and Physical Exam  Exercise Activities and Dietary recommendations Goals   None     Immunization History  Administered Date(s) Administered  . Tdap 08/27/2016    Health Maintenance  Topic Date Due  . HIV Screening  09/16/1985  . PAP SMEAR-Modifier  09/17/1991  . INFLUENZA VACCINE  05/02/2019 (Originally 09/02/2018)  . TETANUS/TDAP  08/28/2026     Discussed health benefits of physical activity, and encouraged her to engage in regular exercise appropriate for her age and condition.    --------------------------------------------------------------------  Problem List Items Addressed This Visit      Cardiovascular and Mediastinum   Essential hypertension    Well controlled Continue current medications Recheck metabolic panel F/u in 6 months       Relevant Orders   Comprehensive metabolic panel     Digestive   Gastroesophageal reflux disease without esophagitis    Continue PPI         Endocrine   Insulin resistance    Advised on low carb Recheck A1c      Relevant Orders   Hemoglobin A1c     Musculoskeletal  and Integument   Hand arthritis    Chronic and stable She has previously been evaluated by former PCP and have positive ANA, but negative follow-up testing She also had a negative rheumatoid factor She has never seen a rheumatologist Given her stiffness, swelling, and that pain is primarily in her DIPs, as well as this is associated with some nail pitting and possible psoriatic rash, concern for possible psoriatic arthritis or other rheumatologic arthritis We will get some initial labs  and refer to rheumatology Will obtain bilateral hand x-rays as well      Relevant Orders   Sed Rate (ESR)   ANA   Rheumatoid Factor   C-reactive protein   DG Hand Complete Left   DG Hand Complete Right   Ambulatory referral to Rheumatology     Other   Mixed hyperlipidemia    Not currently on a statin Recheck fasting lipid panel and CMP today.      Relevant Orders   Lipid panel   Comprehensive metabolic panel   GAD (generalized anxiety disorder)    Chronic, well controlled Continue Wellbutrin and Lexapro at current doses Follow-up in 6 months and repeat GAD-7 and PHQ-9       Other Visit Diagnoses    Encounter for annual physical exam    -  Primary   Relevant Orders   Hemoglobin A1c   TSH   Lipid panel   Comprehensive metabolic panel   CBC w/Diff/Platelet       Return in about 6 months (around 04/26/2019) for chronic disease f/u.   The entirety of the information documented in the History of Present Illness, Review of Systems and Physical Exam were personally obtained by me. Portions of this information were initially documented by Tiburcio Pea, CMA and reviewed by me for thoroughness and accuracy.    Jeyren Danowski, Dionne Bucy, MD MPH [facility]

## 2018-10-27 NOTE — Assessment & Plan Note (Signed)
Well controlled Continue current medications Recheck metabolic panel F/u in 6 months  

## 2018-10-27 NOTE — Assessment & Plan Note (Signed)
Not currently on a statin Recheck fasting lipid panel and CMP today.

## 2018-10-31 ENCOUNTER — Encounter: Payer: Self-pay | Admitting: Family Medicine

## 2018-10-31 LAB — CBC WITH DIFFERENTIAL/PLATELET
Basophils Absolute: 0 10*3/uL (ref 0.0–0.2)
Basos: 0 %
EOS (ABSOLUTE): 0.1 10*3/uL (ref 0.0–0.4)
Eos: 2 %
Hematocrit: 39 % (ref 34.0–46.6)
Hemoglobin: 13 g/dL (ref 11.1–15.9)
Immature Grans (Abs): 0 10*3/uL (ref 0.0–0.1)
Immature Granulocytes: 0 %
Lymphocytes Absolute: 1.9 10*3/uL (ref 0.7–3.1)
Lymphs: 40 %
MCH: 29.2 pg (ref 26.6–33.0)
MCHC: 33.3 g/dL (ref 31.5–35.7)
MCV: 88 fL (ref 79–97)
Monocytes Absolute: 0.3 10*3/uL (ref 0.1–0.9)
Monocytes: 7 %
Neutrophils Absolute: 2.3 10*3/uL (ref 1.4–7.0)
Neutrophils: 51 %
Platelets: 254 10*3/uL (ref 150–450)
RBC: 4.45 x10E6/uL (ref 3.77–5.28)
RDW: 11.8 % (ref 11.7–15.4)
WBC: 4.7 10*3/uL (ref 3.4–10.8)

## 2018-10-31 LAB — COMPREHENSIVE METABOLIC PANEL
ALT: 23 IU/L (ref 0–32)
AST: 22 IU/L (ref 0–40)
Albumin/Globulin Ratio: 1.8 (ref 1.2–2.2)
Albumin: 4.2 g/dL (ref 3.8–4.8)
Alkaline Phosphatase: 90 IU/L (ref 39–117)
BUN/Creatinine Ratio: 21 (ref 9–23)
BUN: 16 mg/dL (ref 6–24)
Bilirubin Total: 0.5 mg/dL (ref 0.0–1.2)
CO2: 24 mmol/L (ref 20–29)
Calcium: 9.7 mg/dL (ref 8.7–10.2)
Chloride: 101 mmol/L (ref 96–106)
Creatinine, Ser: 0.76 mg/dL (ref 0.57–1.00)
GFR calc Af Amer: 107 mL/min/{1.73_m2} (ref >59)
GFR calc non Af Amer: 93 mL/min/{1.73_m2} (ref >59)
Globulin, Total: 2.4 g/dL (ref 1.5–4.5)
Glucose: 87 mg/dL (ref 65–99)
Potassium: 4.4 mmol/L (ref 3.5–5.2)
Sodium: 138 mmol/L (ref 134–144)
Total Protein: 6.6 g/dL (ref 6.0–8.5)

## 2018-10-31 LAB — ANA: ANA Titer 1: NEGATIVE

## 2018-10-31 LAB — LIPID PANEL
Chol/HDL Ratio: 4.8 ratio — ABNORMAL HIGH (ref 0.0–4.4)
Cholesterol, Total: 196 mg/dL (ref 100–199)
HDL: 41 mg/dL (ref >39)
LDL Chol Calc (NIH): 127 mg/dL — ABNORMAL HIGH (ref 0–99)
Triglycerides: 157 mg/dL — ABNORMAL HIGH (ref 0–149)
VLDL Cholesterol Cal: 28 mg/dL (ref 5–40)

## 2018-10-31 LAB — C-REACTIVE PROTEIN: CRP: 3 mg/L (ref 0–10)

## 2018-10-31 LAB — TSH: TSH: 1.43 u[IU]/mL (ref 0.450–4.500)

## 2018-10-31 LAB — HEMOGLOBIN A1C
Est. average glucose Bld gHb Est-mCnc: 117 mg/dL
Hgb A1c MFr Bld: 5.7 % — ABNORMAL HIGH (ref 4.8–5.6)

## 2018-10-31 LAB — SEDIMENTATION RATE: Sed Rate: 25 mm/hr (ref 0–32)

## 2018-10-31 LAB — RHEUMATOID FACTOR: Rheumatoid fact SerPl-aCnc: <10 IU/mL (ref 0.0–13.9)

## 2018-11-20 DIAGNOSIS — M159 Polyosteoarthritis, unspecified: Secondary | ICD-10-CM | POA: Insufficient documentation

## 2018-11-20 DIAGNOSIS — M79641 Pain in right hand: Secondary | ICD-10-CM | POA: Insufficient documentation

## 2018-11-20 DIAGNOSIS — M79642 Pain in left hand: Secondary | ICD-10-CM | POA: Insufficient documentation

## 2019-01-08 ENCOUNTER — Encounter: Payer: Self-pay | Admitting: Family Medicine

## 2019-01-11 NOTE — Telephone Encounter (Signed)
Form completed.  Please scan copy to the chart and get copy to the patient. Thanks!

## 2019-04-22 ENCOUNTER — Encounter: Payer: Self-pay | Admitting: Family Medicine

## 2019-04-26 ENCOUNTER — Encounter: Payer: Self-pay | Admitting: Family Medicine

## 2019-04-26 ENCOUNTER — Telehealth (INDEPENDENT_AMBULATORY_CARE_PROVIDER_SITE_OTHER): Payer: BC Managed Care – PPO | Admitting: Family Medicine

## 2019-04-26 VITALS — BP 136/83

## 2019-04-26 DIAGNOSIS — E8881 Metabolic syndrome: Secondary | ICD-10-CM | POA: Diagnosis not present

## 2019-04-26 DIAGNOSIS — F411 Generalized anxiety disorder: Secondary | ICD-10-CM

## 2019-04-26 DIAGNOSIS — I10 Essential (primary) hypertension: Secondary | ICD-10-CM

## 2019-04-26 MED ORDER — BUPROPION HCL 75 MG PO TABS: ORAL_TABLET | ORAL | 3 refills | Status: DC

## 2019-04-26 MED ORDER — METOPROLOL SUCCINATE ER 100 MG PO TB24: 100.0000 mg | ORAL_TABLET | Freq: Every day | ORAL | 3 refills | Status: DC

## 2019-04-26 MED ORDER — PANTOPRAZOLE SODIUM 20 MG PO TBEC: 20.0000 mg | DELAYED_RELEASE_TABLET | Freq: Every day | ORAL | 3 refills | Status: DC

## 2019-04-26 MED ORDER — HYDROCHLOROTHIAZIDE 25 MG PO TABS: 25.0000 mg | ORAL_TABLET | Freq: Every day | ORAL | 3 refills | Status: DC

## 2019-04-26 MED ORDER — ESCITALOPRAM OXALATE 10 MG PO TABS: 10.0000 mg | ORAL_TABLET | Freq: Every day | ORAL | 3 refills | Status: DC

## 2019-04-26 NOTE — Progress Notes (Signed)
Patient: Janice Gutierrez Female    DOB: [birth year]   49 y.o.   MRN: [id number] Visit Date: 04/26/2019  Today's Provider: Lavon Paganini, MD   Chief Complaint  Patient presents with  . Hypertension   Subjective:    Virtual Visit via Video Note  I connected with Janice Gutierrez on 04/26/19 at  8:20 AM EDT by a video enabled telemedicine application and verified that I am speaking with the correct person using two identifiers.  Location: Patient location: home Provider [facility]: [facility] Persons involved in the visit: patient, provider   I discussed the limitations of evaluation and management by telemedicine and the availability of in person appointments. The patient expressed understanding and agreed to proceed.   HPI     Hypertension, follow-up:  BP Readings from Last 3 Encounters:  04/26/19 136/83  10/27/18 122/77  05/24/18 124/87    She was last seen for hypertension 6 months ago.  BP at that visit was 122/77. Management since that visit includes No changes. She reports excellent compliance with treatment. She is not having side effects.  She is exercising. She is adherent to low salt diet.   Outside blood pressures are 120/80's. She is experiencing none.  Patient denies chest pain, fatigue, near-syncope and palpitations.   Cardiovascular risk factors include hypertension.  Use of agents associated with hypertension: none.     Weight trend: stable Wt Readings from Last 3 Encounters:  10/27/18 197 lb 9.6 oz (89.6 kg)  08/30/17 198 lb (89.8 kg)    Current diet: in general, a "healthy" diet    ------------------------------------------------------------------------  GAD 7 : Generalized Anxiety Score 04/26/2019 05/24/2018  Nervous, Anxious, on Edge 0 0  Control/stop worrying 0 0  Worry too much - different things 0 0  Trouble relaxing 0 0  Restless 1 0  Easily annoyed or irritable 0 0  Afraid - awful might happen 0 0  Total GAD 7  Score 1 0  Anxiety Difficulty Not difficult at all Not difficult at all    Depression screen [facility] 2/9 04/26/2019 10/27/2018 05/24/2018 08/30/2017  Decreased Interest 1 0 0 0  Down, Depressed, Hopeless 0 0 0 0  PHQ - 2 Score 1 0 0 0  Altered sleeping - 0 0 -  Tired, decreased energy - 0 0 -  Change in appetite - 0 0 -  Feeling bad or failure about yourself  - 0 0 -  Trouble concentrating - 0 0 -  Moving slowly or fidgety/restless - 0 0 -  Suicidal thoughts - 0 0 -  PHQ-9 Score - 0 0 -  Difficult doing work/chores - Not difficult at all Not difficult at all -      Allergies  Allergen Reactions  . Latex      Current Outpatient Medications:  .  aspirin EC 81 MG tablet, Take 1 tablet by mouth daily., Disp: , Rfl:  .  buPROPion (WELLBUTRIN) 75 MG tablet, TAKE 1/2 TABLETS (37.5 MG TOTAL) BY MOUTH DAILY., Disp: 45 tablet, Rfl: 3 .  escitalopram (LEXAPRO) 10 MG tablet, Take 1 tablet (10 mg total) by mouth daily., Disp: 90 tablet, Rfl: 3 .  hydrochlorothiazide (HYDRODIURIL) 25 MG tablet, Take 1 tablet (25 mg total) by mouth daily., Disp: 90 tablet, Rfl: 3 .  KRILL OIL PO, Take by mouth., Disp: , Rfl:  .  metoprolol succinate (TOPROL-XL) 100 MG 24 hr tablet, Take 1 tablet (100 mg total) by mouth daily., Disp: 90 tablet, Rfl:  3 .  Multiple Vitamins-Minerals (MULTIVITAMIN WOMEN) TABS, Take by mouth., Disp: , Rfl:  .  pantoprazole (PROTONIX) 20 MG tablet, Take 1 tablet (20 mg total) by mouth daily., Disp: 90 tablet, Rfl: 3 .  cetirizine (ZYRTEC) 10 MG tablet, Take 10 mg by mouth daily., Disp: , Rfl:   Review of Systems  Constitutional: Negative.   Respiratory: Negative.   Cardiovascular: Negative.   Gastrointestinal: Negative.   Neurological: Negative for dizziness, light-headedness and headaches.    Social History   Tobacco Use  . Smoking status: Former Smoker    Packs/day: 1.00    Years: 10.00    Pack years: 10.00    Types: Cigarettes    Quit date: 02/01/2008    Years since  quitting: 11.2  . Smokeless tobacco: Never Used  Substance Use Topics  . Alcohol use: No    Alcohol/week: 0.0 standard drinks      Objective:   BP 136/83  Vitals:   04/26/19 0834  BP: 136/83  There is no height or weight on file to calculate BMI.   Physical Exam Constitutional:      General: She is not in acute distress.    Appearance: Normal appearance. She is not diaphoretic.  HENT:     Head: Normocephalic and atraumatic.  Pulmonary:     Effort: Pulmonary effort is normal. No respiratory distress.  Neurological:     Mental Status: She is alert and oriented to person, place, and time. Mental status is at baseline.  Psychiatric:        Mood and Affect: Mood normal.        Behavior: Behavior normal.      No results found for any visits on 04/26/19.     Assessment & Plan    Follow Up Instructions: I discussed the assessment and treatment plan with the patient. The patient was provided an opportunity to ask questions and all were answered. The patient agreed with the plan and demonstrated an understanding of the instructions.   The patient was advised to call back or seek an in-person evaluation if the symptoms worsen or if the condition fails to improve as anticipated.   Problem List Items Addressed This Visit      Cardiovascular and Mediastinum   Essential hypertension - Primary    Well controlled Continue current medications Recheck metabolic panel F/u in 6 months       Relevant Medications   metoprolol succinate (TOPROL-XL) 100 MG 24 hr tablet   hydrochlorothiazide (HYDRODIURIL) 25 MG tablet   Other Relevant Orders   Basic Metabolic Panel (BMET)     Endocrine   Insulin resistance    Low carb diet Recheck A1c      Relevant Orders   Hemoglobin A1c     Other   GAD (generalized anxiety disorder)    Chronic and well controlled Continue Wellbutrin and Lexapro at current doses F/u in 6 months and repeat GAD7 and PHQ9      Relevant Medications    escitalopram (LEXAPRO) 10 MG tablet   buPROPion (WELLBUTRIN) 75 MG tablet       Return in about 6 months (around 10/27/2019) for CPE.   The entirety of the information documented in the History of Present Illness, Review of Systems and Physical Exam were personally obtained by me. Portions of this information were initially documented by Ashley Royalty, CMA and reviewed by me for thoroughness and accuracy.    Damarion Mendizabal, Dionne Bucy, MD MPH [facility]

## 2019-04-26 NOTE — Assessment & Plan Note (Signed)
Low-carb diet Recheck A1c 

## 2019-04-26 NOTE — Assessment & Plan Note (Signed)
Well controlled Continue current medications Recheck metabolic panel F/u in 6 months  

## 2019-04-26 NOTE — Patient Instructions (Signed)

## 2019-04-26 NOTE — Assessment & Plan Note (Signed)
Chronic and well controlled Continue Wellbutrin and Lexapro at current doses F/u in 6 months and repeat GAD7 and PHQ9

## 2019-05-05 LAB — BASIC METABOLIC PANEL
BUN/Creatinine Ratio: 21 (ref 9–23)
BUN: 14 mg/dL (ref 6–24)
CO2: 24 mmol/L (ref 20–29)
Calcium: 9.7 mg/dL (ref 8.7–10.2)
Chloride: 102 mmol/L (ref 96–106)
Creatinine, Ser: 0.68 mg/dL (ref 0.57–1.00)
GFR calc Af Amer: 120 mL/min/{1.73_m2} (ref >59)
GFR calc non Af Amer: 104 mL/min/{1.73_m2} (ref >59)
Glucose: 97 mg/dL (ref 65–99)
Potassium: 4.3 mmol/L (ref 3.5–5.2)
Sodium: 139 mmol/L (ref 134–144)

## 2019-05-05 LAB — HEMOGLOBIN A1C
Est. average glucose Bld gHb Est-mCnc: 114 mg/dL
Hgb A1c MFr Bld: 5.6 % (ref 4.8–5.6)

## 2019-05-07 ENCOUNTER — Telehealth: Payer: Self-pay

## 2019-05-07 NOTE — Telephone Encounter (Signed)
[employer]   Result Notes and Comments to Patient Comment seen by patient Janice Gutierrez on 05/07/2019 8:27 AM EDT

## 2019-05-07 NOTE — Telephone Encounter (Signed)
-----   Message from Virginia Crews, MD sent at 05/07/2019  8:24 AM EDT ----- Normal labs

## 2019-08-20 ENCOUNTER — Encounter: Payer: Self-pay | Admitting: Family Medicine

## 2019-10-31 ENCOUNTER — Encounter: Payer: BC Managed Care – PPO | Admitting: Family Medicine

## 2019-11-06 ENCOUNTER — Encounter: Payer: Self-pay | Admitting: Family Medicine

## 2019-11-12 ENCOUNTER — Encounter: Payer: Self-pay | Admitting: Family Medicine

## 2019-11-12 NOTE — Telephone Encounter (Signed)
We can schedule a virtual visit to discuss the vaccine if she'd like, but from this message, there is no medical contraindication to vaccination.

## 2019-11-16 ENCOUNTER — Ambulatory Visit: Payer: Self-pay | Admitting: Family Medicine

## 2019-11-23 ENCOUNTER — Encounter: Payer: Self-pay | Admitting: Family Medicine

## 2019-11-26 NOTE — Telephone Encounter (Signed)
Please change her CPE to chronic f/u appt and reschedule as mychart visit. Can leave at same date and time

## 2019-11-27 NOTE — Telephone Encounter (Signed)
Changed.

## 2019-12-03 ENCOUNTER — Telehealth (INDEPENDENT_AMBULATORY_CARE_PROVIDER_SITE_OTHER): Payer: BC Managed Care – PPO | Admitting: Family Medicine

## 2019-12-03 ENCOUNTER — Encounter: Payer: Self-pay | Admitting: Family Medicine

## 2019-12-03 VITALS — BP 135/87

## 2019-12-03 DIAGNOSIS — K219 Gastro-esophageal reflux disease without esophagitis: Secondary | ICD-10-CM

## 2019-12-03 DIAGNOSIS — E782 Mixed hyperlipidemia: Secondary | ICD-10-CM | POA: Diagnosis not present

## 2019-12-03 DIAGNOSIS — F411 Generalized anxiety disorder: Secondary | ICD-10-CM | POA: Diagnosis not present

## 2019-12-03 DIAGNOSIS — R739 Hyperglycemia, unspecified: Secondary | ICD-10-CM

## 2019-12-03 DIAGNOSIS — E8881 Metabolic syndrome: Secondary | ICD-10-CM

## 2019-12-03 DIAGNOSIS — I10 Essential (primary) hypertension: Secondary | ICD-10-CM | POA: Diagnosis not present

## 2019-12-03 MED ORDER — HYDROCHLOROTHIAZIDE 25 MG PO TABS: 25.0000 mg | ORAL_TABLET | Freq: Every day | ORAL | 3 refills | Status: DC

## 2019-12-03 MED ORDER — ESCITALOPRAM OXALATE 10 MG PO TABS: 10.0000 mg | ORAL_TABLET | Freq: Every day | ORAL | 3 refills | Status: DC

## 2019-12-03 MED ORDER — PANTOPRAZOLE SODIUM 20 MG PO TBEC: 20.0000 mg | DELAYED_RELEASE_TABLET | Freq: Every day | ORAL | 3 refills | Status: DC

## 2019-12-03 MED ORDER — BUPROPION HCL 75 MG PO TABS: ORAL_TABLET | ORAL | 3 refills | Status: DC

## 2019-12-03 MED ORDER — METOPROLOL SUCCINATE ER 100 MG PO TB24: 100.0000 mg | ORAL_TABLET | Freq: Every day | ORAL | 3 refills | Status: DC

## 2019-12-03 NOTE — Progress Notes (Signed)
MyChart Video Visit    Virtual Visit via Video Note   This visit type was conducted due to national recommendations for restrictions regarding the COVID-19 Pandemic (e.g. social distancing) in an effort to limit this patient's exposure and mitigate transmission in our community. This patient is at least at moderate risk for complications without adequate follow up. This format is felt to be most appropriate for this patient at this time. Physical exam was limited by quality of the video and audio technology used for the visit.    Patient location: home Provider location: [place] involved in the visit: patient, provider  I discussed the limitations of evaluation and management by telemedicine and the availability of in person appointments. The patient expressed understanding and agreed to proceed.  Patient: Janice Gutierrez   DOB: 31-Jan-1971   49 y.o. Female  MRN: [id number] Visit Date: 12/03/2019  Today's healthcare provider: Lavon Paganini, MD   Chief Complaint  Patient presents with  . Gastroesophageal Reflux  . Hypertension  . Anxiety   Subjective    HPI   HTN: - Medications: HCTZ, metoprolol - Compliance: good - Checking BP at home: yes, well controlled, 120s/80s  - Denies any SOB, CP, vision changes, LE edema, medication SEs, or symptoms of hypotension - Diet: low sodium - Exercise: intermittent  Lexapro and Wellbutrin are controlling anxiety well  GERD is well controlled    Social History   Tobacco Use  . Smoking status: Former Smoker    Packs/day: 1.00    Years: 10.00    Pack years: 10.00    Types: Cigarettes    Quit date: 02/01/2008    Years since quitting: 11.8  . Smokeless tobacco: Never Used  Vaping Use  . Vaping Use: Never used  Substance Use Topics  . Alcohol use: No    Alcohol/week: 0.0 standard drinks  . Drug use: No      Medications: Outpatient Medications Prior to Visit  Medication Sig  . aspirin EC 81  MG tablet Take 1 tablet by mouth daily.  . cetirizine (ZYRTEC) 10 MG tablet Take 10 mg by mouth daily.  Marland Kitchen KRILL OIL PO Take by mouth.  . [DISCONTINUED] buPROPion (WELLBUTRIN) 75 MG tablet TAKE 1/2 TABLETS (37.5 MG TOTAL) BY MOUTH DAILY.  . [DISCONTINUED] escitalopram (LEXAPRO) 10 MG tablet Take 1 tablet (10 mg total) by mouth daily.  . [DISCONTINUED] hydrochlorothiazide (HYDRODIURIL) 25 MG tablet Take 1 tablet (25 mg total) by mouth daily.  . [DISCONTINUED] metoprolol succinate (TOPROL-XL) 100 MG 24 hr tablet Take 1 tablet (100 mg total) by mouth daily.  . [DISCONTINUED] Multiple Vitamins-Minerals (MULTIVITAMIN WOMEN) TABS Take by mouth.  . [DISCONTINUED] pantoprazole (PROTONIX) 20 MG tablet Take 1 tablet (20 mg total) by mouth daily.   No facility-administered medications prior to visit.    Review of Systems  Constitutional: Negative.   Respiratory: Negative.   Cardiovascular: Negative.   Musculoskeletal: Negative.   Neurological: Negative.   Psychiatric/Behavioral: Negative.     Last metabolic panel Lab Results  Component Value Date   GLUCOSE 97 05/04/2019   NA 139 05/04/2019   K 4.3 05/04/2019   CL 102 05/04/2019   CO2 24 05/04/2019   BUN 14 05/04/2019   CREATININE 0.68 05/04/2019   GFRNONAA 104 05/04/2019   GFRAA 120 05/04/2019   CALCIUM 9.7 05/04/2019   PROT 6.6 10/27/2018   ALBUMIN 4.2 10/27/2018   LABGLOB 2.4 10/27/2018   AGRATIO 1.8 10/27/2018   BILITOT 0.5 10/27/2018  ALKPHOS 90 10/27/2018   AST 22 10/27/2018   ALT 23 10/27/2018   Last lipids Lab Results  Component Value Date   CHOL 196 10/27/2018   HDL 41 10/27/2018   LDLCALC 127 (H) 10/27/2018   TRIG 157 (H) 10/27/2018   CHOLHDL 4.8 (H) 10/27/2018   Last hemoglobin A1c Lab Results  Component Value Date   HGBA1C 5.6 05/04/2019      Objective    BP 135/87  BP Readings from Last 3 Encounters:  12/03/19 135/87  04/26/19 136/83  10/27/18 122/77   Wt Readings from Last 3 Encounters:  10/27/18  197 lb 9.6 oz (89.6 kg)  08/30/17 198 lb (89.8 kg)      Physical Exam Constitutional:      General: She is not in acute distress.    Appearance: Normal appearance. She is not diaphoretic.  Pulmonary:     Effort: Pulmonary effort is normal. No respiratory distress.  Neurological:     Mental Status: She is alert and oriented to person, place, and time. Mental status is at baseline.  Psychiatric:        Mood and Affect: Mood normal.        Behavior: Behavior normal.        Assessment & Plan     Problem List Items Addressed This Visit      Cardiovascular and Mediastinum   Essential hypertension - Primary    Well controlled Continue current medications Recheck metabolic panel F/u in 6 months       Relevant Medications   hydrochlorothiazide (HYDRODIURIL) 25 MG tablet   metoprolol succinate (TOPROL-XL) 100 MG 24 hr tablet   Other Relevant Orders   Comprehensive metabolic panel     Digestive   Gastroesophageal reflux disease without esophagitis    Well-controlled Continue PPI      Relevant Medications   pantoprazole (PROTONIX) 20 MG tablet     Endocrine   Insulin resistance    Low-carb diet Recheck A1c        Other   Mixed hyperlipidemia    Not currently on a statin Recheck FLP and CMP      Relevant Medications   hydrochlorothiazide (HYDRODIURIL) 25 MG tablet   metoprolol succinate (TOPROL-XL) 100 MG 24 hr tablet   Other Relevant Orders   Lipid panel   Comprehensive metabolic panel   GAD (generalized anxiety disorder)    Chronic and well-controlled Continue Wellbutrin and Lexapro at current doses Follow-up in 6 months and repeat PHQ-9 and GAD-7      Relevant Medications   buPROPion (WELLBUTRIN) 75 MG tablet   escitalopram (LEXAPRO) 10 MG tablet    Other Visit Diagnoses    Hyperglycemia       Relevant Orders   Hemoglobin A1c       Return in about 6 months (around 06/01/2020) for CPE.     I discussed the assessment and treatment plan with  the patient. The patient was provided an opportunity to ask questions and all were answered. The patient agreed with the plan and demonstrated an understanding of the instructions.   The patient was advised to call back or seek an in-person evaluation if the symptoms worsen or if the condition fails to improve as anticipated.  I, Lavon Paganini, MD, have reviewed all documentation for this visit. The documentation on 12/03/19 for the exam, diagnosis, procedures, and orders are all accurate and complete.   Niomie Englert, Dionne Bucy, MD, MPH [facility]

## 2019-12-03 NOTE — Assessment & Plan Note (Signed)
Well controlled Continue current medications Recheck metabolic panel F/u in 6 months  

## 2019-12-03 NOTE — Assessment & Plan Note (Signed)
Low-carb diet Recheck A1c

## 2019-12-03 NOTE — Assessment & Plan Note (Signed)
Well controlled Continue PPI 

## 2019-12-03 NOTE — Assessment & Plan Note (Signed)
Not currently on a statin Recheck FLP and CMP

## 2019-12-03 NOTE — Assessment & Plan Note (Signed)
Chronic and well-controlled Continue Wellbutrin and Lexapro at current doses Follow-up in 6 months and repeat PHQ-9 and GAD-7

## 2019-12-05 ENCOUNTER — Telehealth: Payer: Self-pay

## 2019-12-05 LAB — LIPID PANEL
Chol/HDL Ratio: 3.9 ratio (ref 0.0–4.4)
Cholesterol, Total: 183 mg/dL (ref 100–199)
HDL: 47 mg/dL (ref >39)
LDL Chol Calc (NIH): 107 mg/dL — ABNORMAL HIGH (ref 0–99)
Triglycerides: 166 mg/dL — ABNORMAL HIGH (ref 0–149)
VLDL Cholesterol Cal: 29 mg/dL (ref 5–40)

## 2019-12-05 LAB — COMPREHENSIVE METABOLIC PANEL
ALT: 11 IU/L (ref 0–32)
AST: 11 IU/L (ref 0–40)
Albumin/Globulin Ratio: 1.4 (ref 1.2–2.2)
Albumin: 3.9 g/dL (ref 3.8–4.8)
Alkaline Phosphatase: 75 IU/L (ref 44–121)
BUN/Creatinine Ratio: 18 (ref 9–23)
BUN: 12 mg/dL (ref 6–24)
Bilirubin Total: 0.4 mg/dL (ref 0.0–1.2)
CO2: 24 mmol/L (ref 20–29)
Calcium: 9.3 mg/dL (ref 8.7–10.2)
Chloride: 101 mmol/L (ref 96–106)
Creatinine, Ser: 0.67 mg/dL (ref 0.57–1.00)
GFR calc Af Amer: 119 mL/min/{1.73_m2} (ref >59)
GFR calc non Af Amer: 104 mL/min/{1.73_m2} (ref >59)
Globulin, Total: 2.8 g/dL (ref 1.5–4.5)
Glucose: 84 mg/dL (ref 65–99)
Potassium: 4.3 mmol/L (ref 3.5–5.2)
Sodium: 138 mmol/L (ref 134–144)
Total Protein: 6.7 g/dL (ref 6.0–8.5)

## 2019-12-05 LAB — HEMOGLOBIN A1C
Est. average glucose Bld gHb Est-mCnc: 120 mg/dL
Hgb A1c MFr Bld: 5.8 % — ABNORMAL HIGH (ref 4.8–5.6)

## 2019-12-05 NOTE — Telephone Encounter (Signed)
Written by Virginia Crews, MD on 12/05/2019 9:18 AM EDT View Full Comments Seen by patient [name] on 12/05/2019 9:22 AM

## 2019-12-05 NOTE — Telephone Encounter (Signed)
-----   Message from Virginia Crews, MD sent at 12/05/2019  9:18 AM EDT ----- Normal kidney function, liver function, electrolytes.  Cholesterol slightly elevated, but better than last year. The 10-year ASCVD (heart disease and stroke) risk score Janice Bussing DC Jr., et al., 2013) is: 1.8%, which is low.  No medication needed at this time, but I do recommend diet low in saturated fat and regular exercise - 30 min at least 5 times per week.  Hemoglobin A1c, 3 month avg of blood sugars, is in prediabetic range.  In order to prevent progression to diabetes, recommend low carb diet and regular exercise.

## 2020-04-15 LAB — HM MAMMOGRAPHY

## 2020-08-23 IMAGING — CR DG HAND COMPLETE 3+V*R*
1 series · 3 of 3 positions shown · non-contrast
Comparison: None.

CLINICAL DATA: Bilateral hand pain, arthritis, DIP pain

EXAM:
LEFT HAND - COMPLETE 3+ VIEW; RIGHT HAND - COMPLETE 3+ VIEW

[Series 1: dg hand complete right · 0.14mm/px · 3 of 3 slices shown]
[im 1/3]
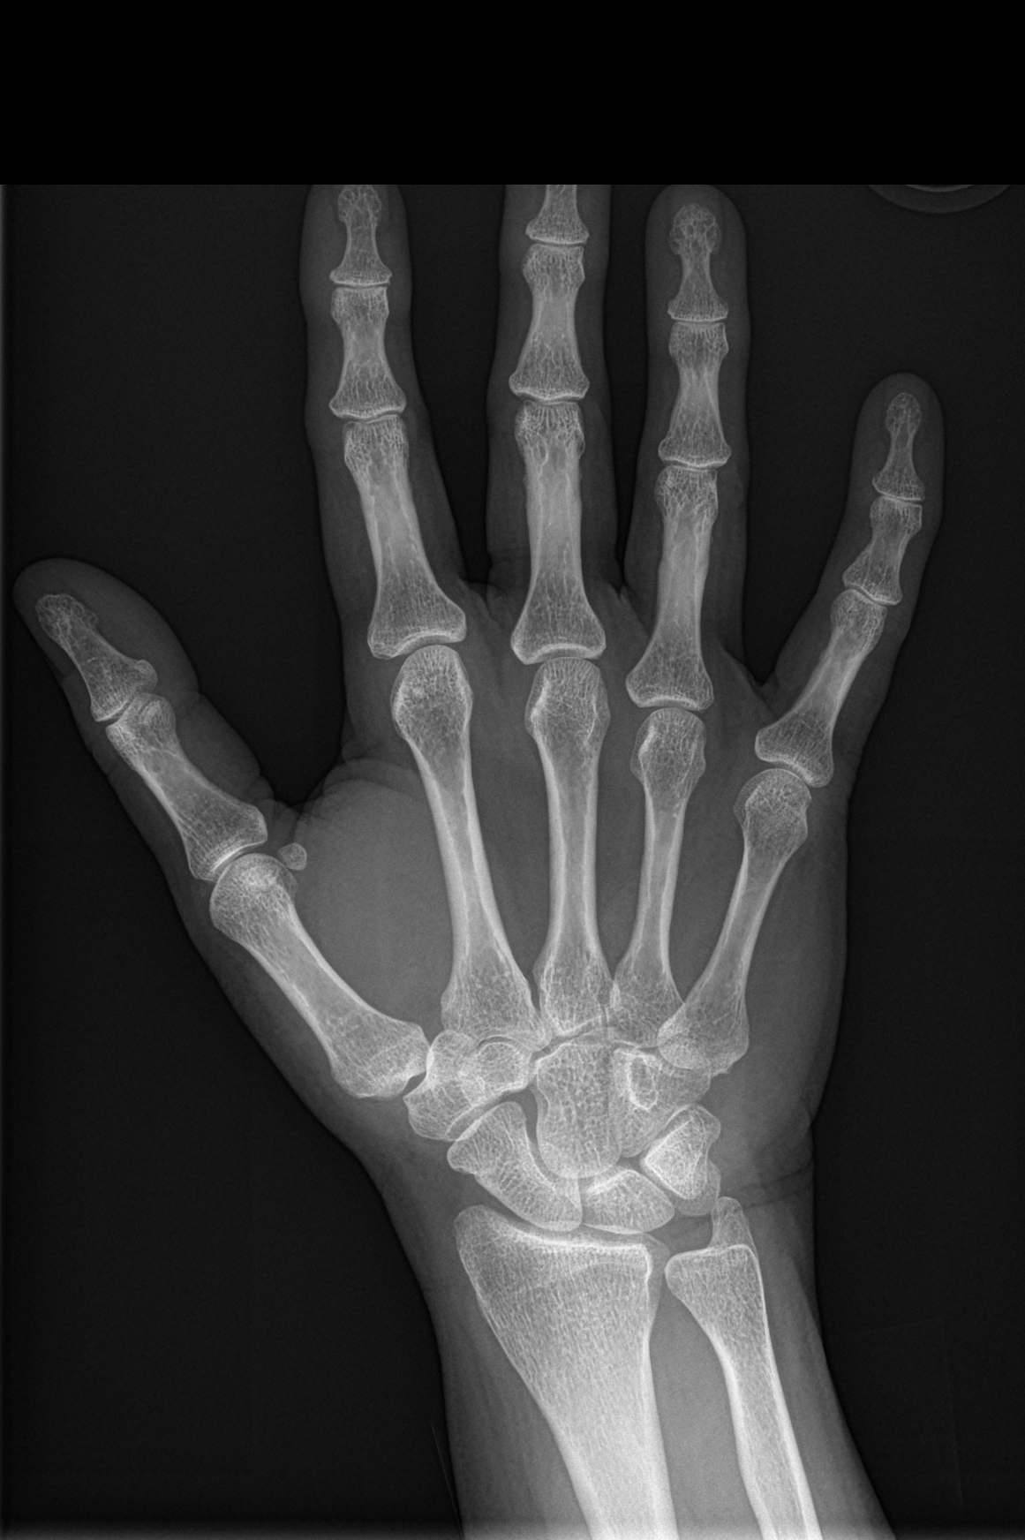
[im 2/3]
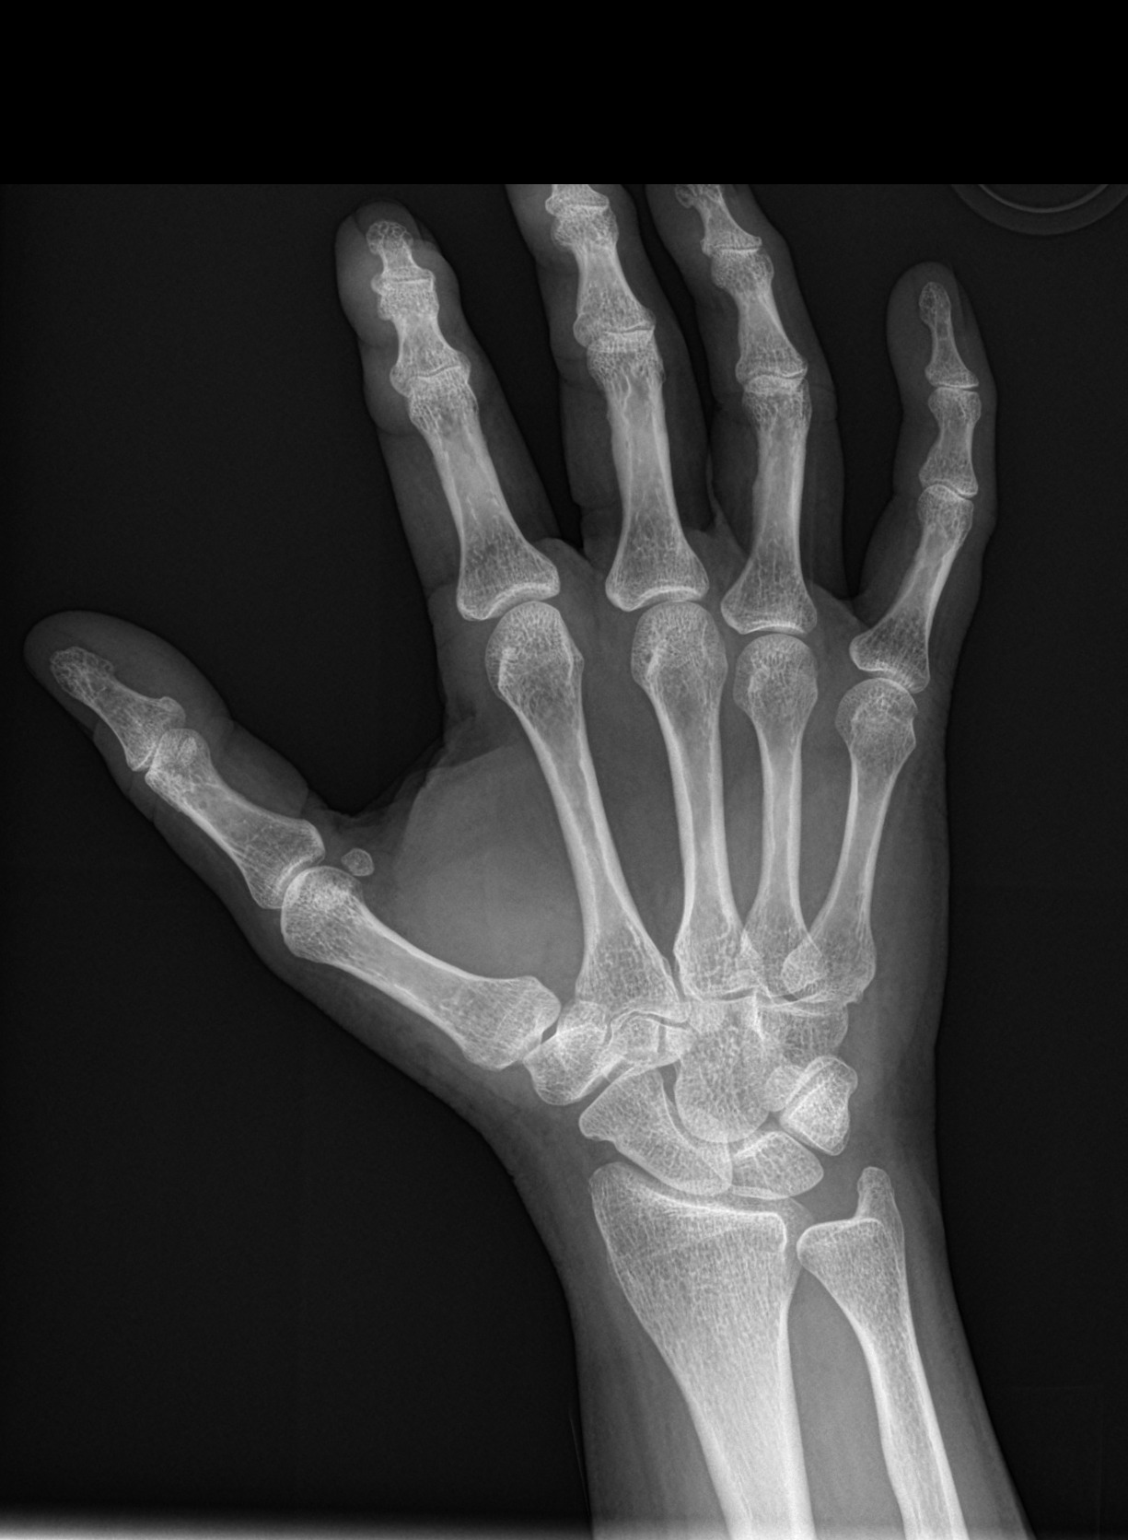
[im 3/3]
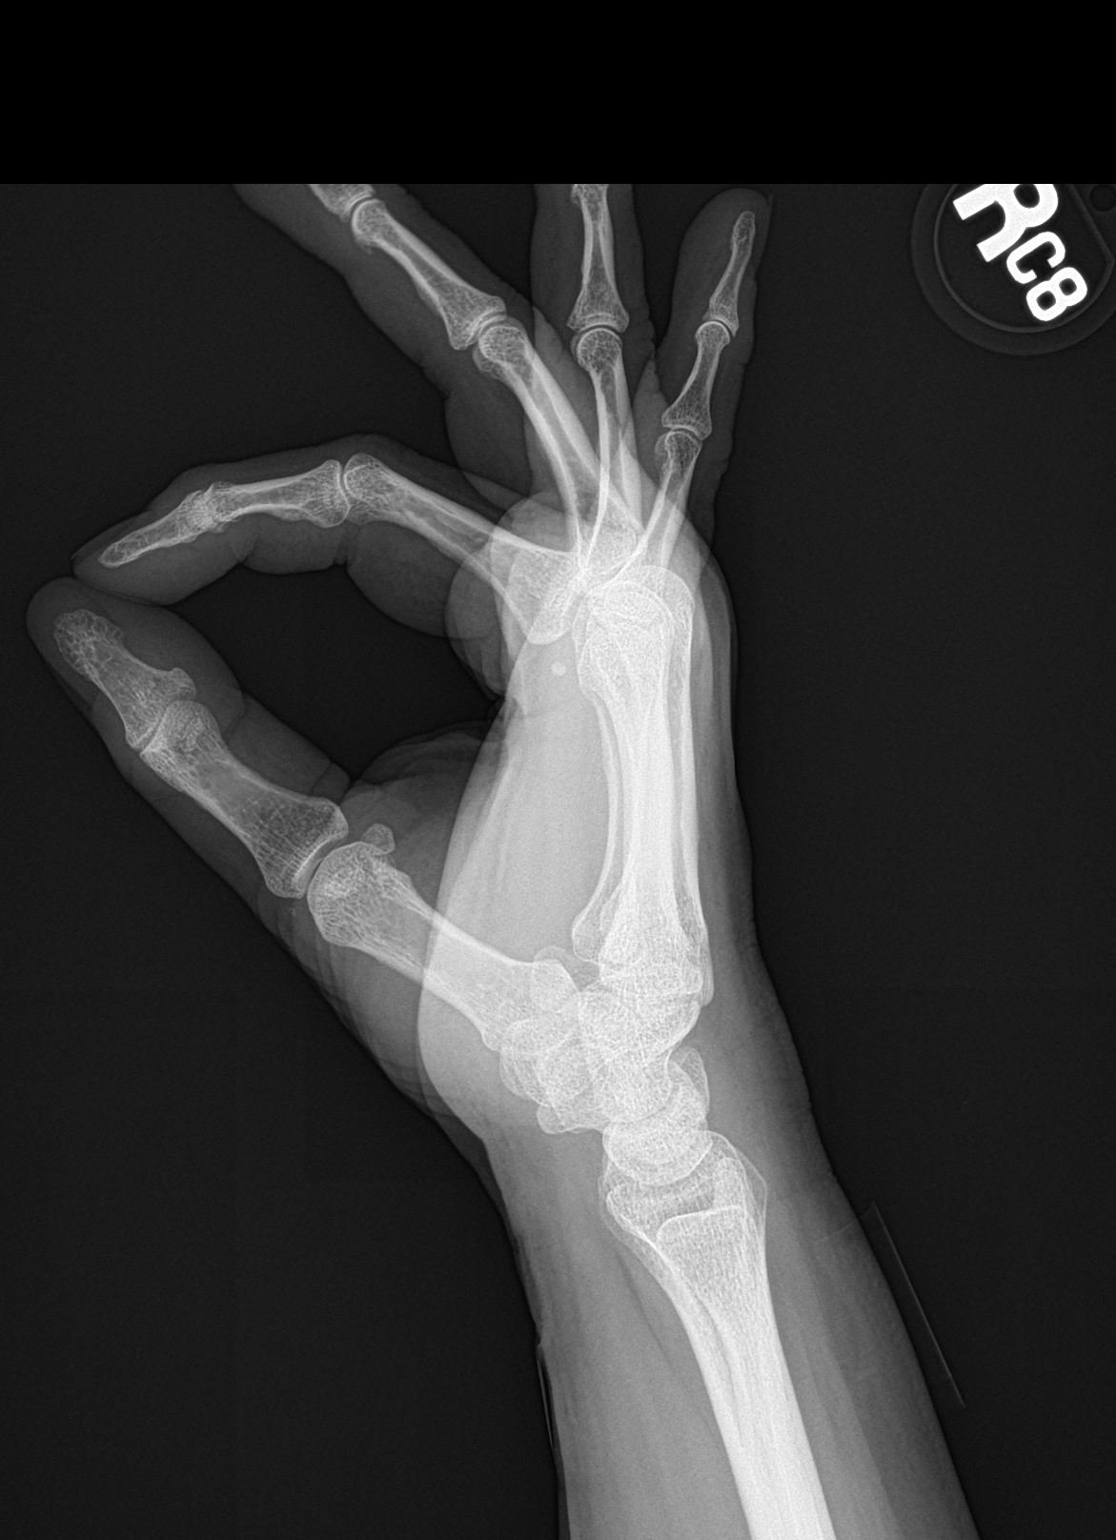

[3 of 3 positions shown; findings below may reference images not displayed]

FINDINGS: No fracture or dislocation of the bilateral hands. There is minimal,
symmetric osteoarthritic degenerative change about the hands and
wrists, including the distal interphalangeal joints. No evidence of
erosive arthropathy. Soft tissues are unremarkable.
IMPRESSION: No fracture or dislocation of the bilateral hands. There is minimal,
symmetric osteoarthritic degenerative change about the hands and
wrists, including the distal interphalangeal joints. No evidence of
erosive arthropathy.

## 2020-08-23 IMAGING — CR DG HAND COMPLETE 3+V*L*
1 series · 3 of 3 positions shown · non-contrast
Comparison: None.

CLINICAL DATA: Bilateral hand pain, arthritis, DIP pain

EXAM:
LEFT HAND - COMPLETE 3+ VIEW; RIGHT HAND - COMPLETE 3+ VIEW

[Series 1: dg hand complete left · 0.14mm/px · 3 of 3 slices shown]
[im 1/3]
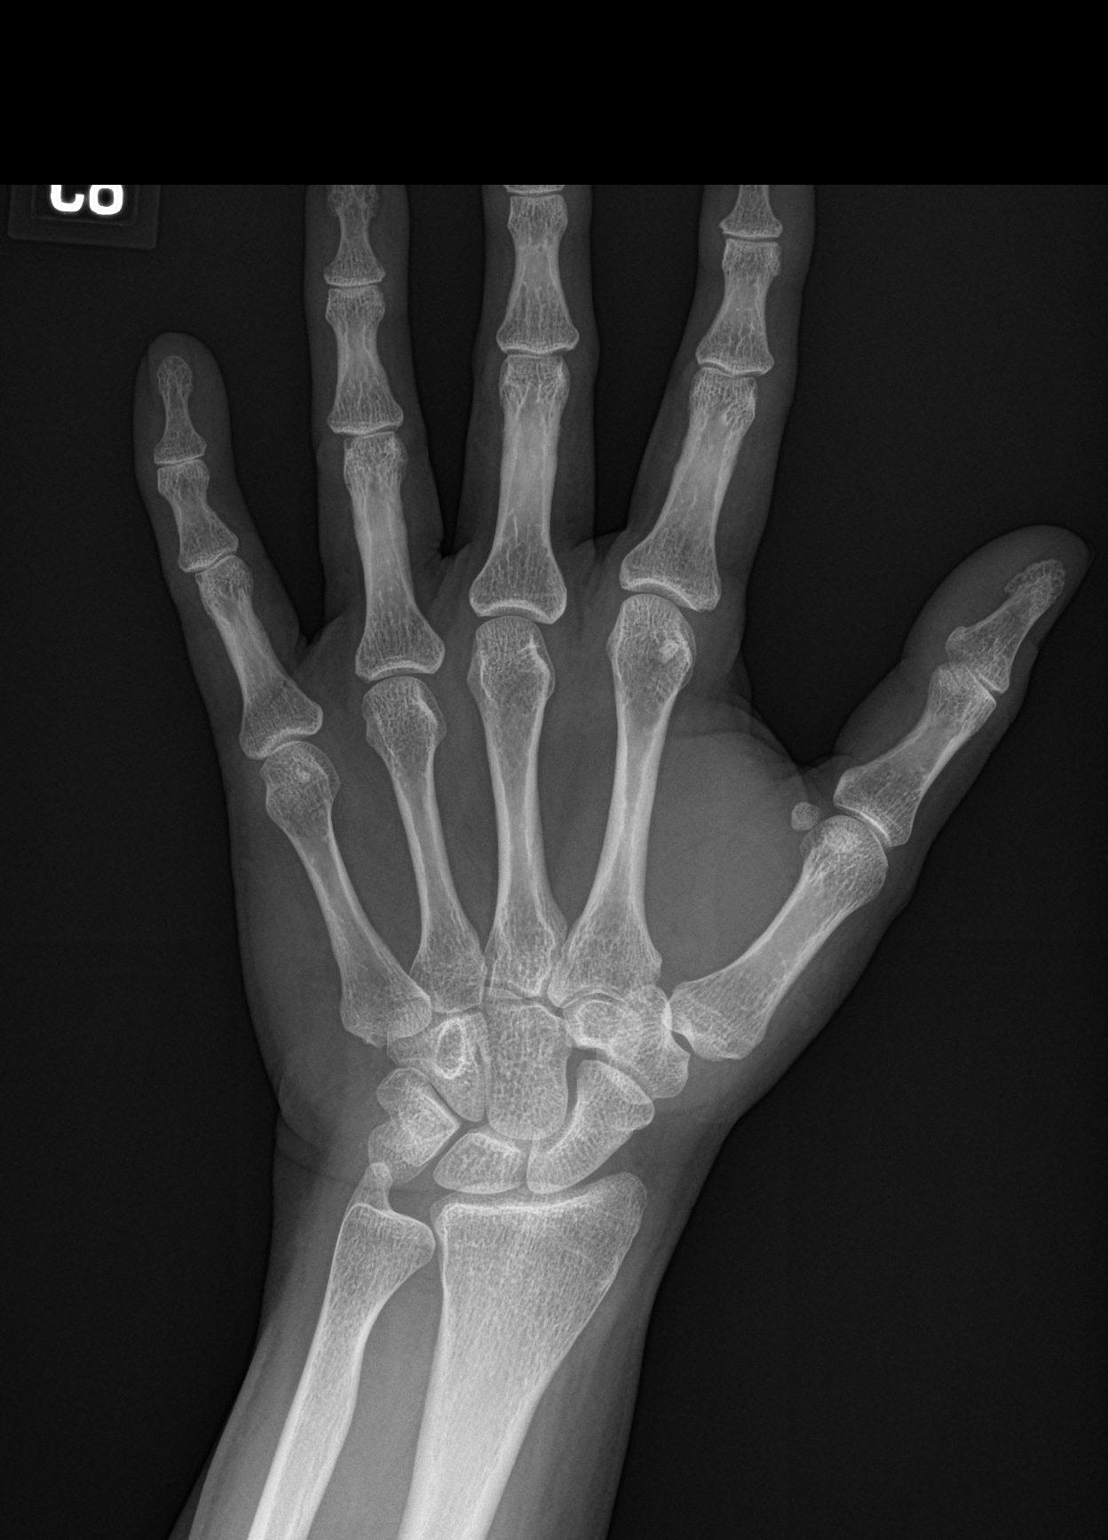
[im 2/3]
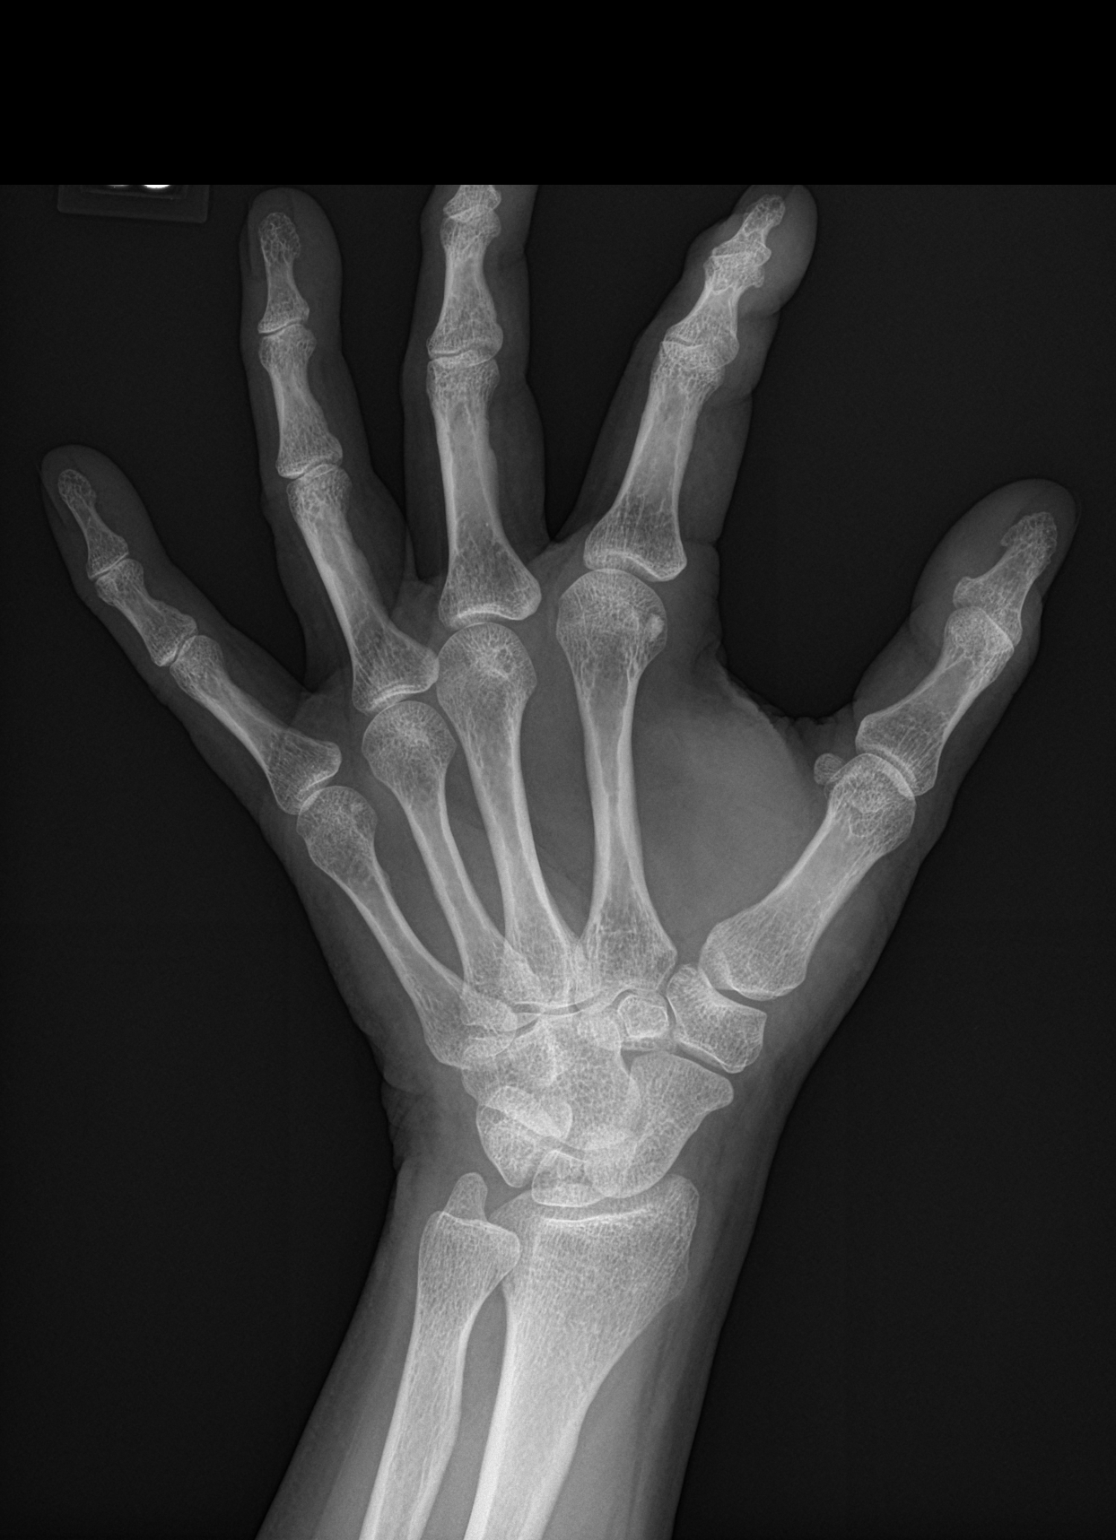
[im 3/3]
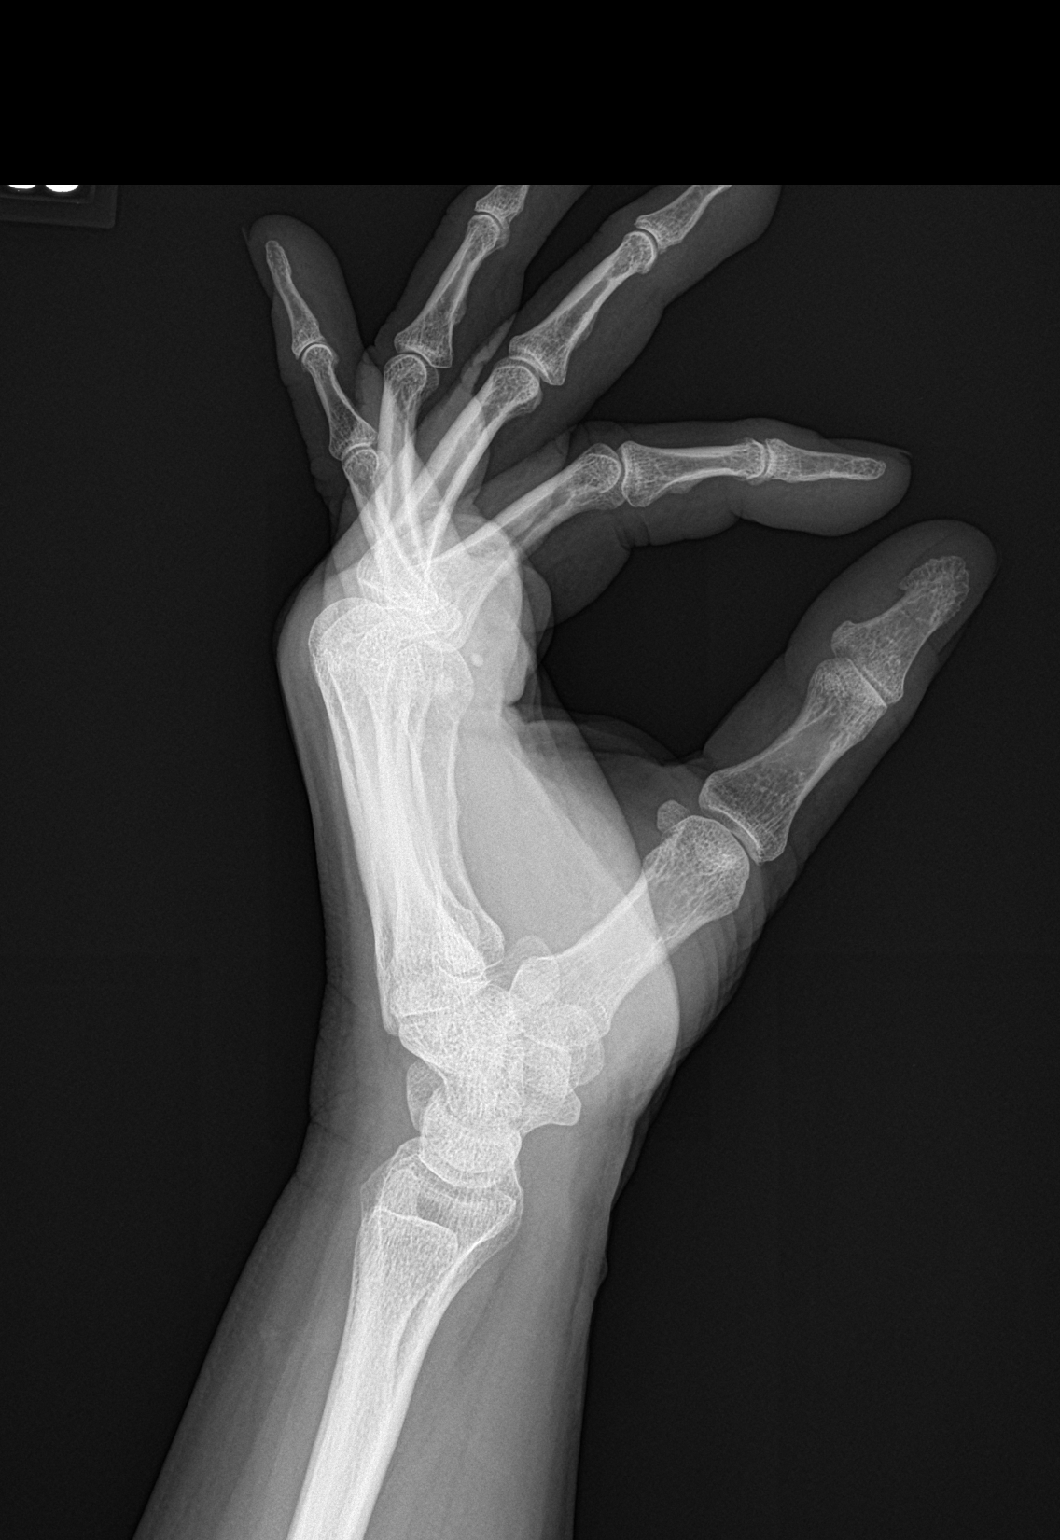

[3 of 3 positions shown; findings below may reference images not displayed]

FINDINGS: No fracture or dislocation of the bilateral hands. There is minimal,
symmetric osteoarthritic degenerative change about the hands and
wrists, including the distal interphalangeal joints. No evidence of
erosive arthropathy. Soft tissues are unremarkable.
IMPRESSION: No fracture or dislocation of the bilateral hands. There is minimal,
symmetric osteoarthritic degenerative change about the hands and
wrists, including the distal interphalangeal joints. No evidence of
erosive arthropathy.

## 2020-09-27 LAB — COLOGUARD: Cologuard: NEGATIVE

## 2020-10-07 LAB — COLOGUARD: COLOGUARD: NEGATIVE

## 2020-11-18 ENCOUNTER — Encounter: Payer: Self-pay | Admitting: Family Medicine

## 2020-11-19 NOTE — Telephone Encounter (Signed)
It can be virtual if she wants.  She will need lipids, A1c, CMP. Also HIV and Hep C screening if she agrees. She can get these at her convenience, fasting a few days before appt.

## 2020-12-25 ENCOUNTER — Other Ambulatory Visit: Payer: Self-pay | Admitting: Family Medicine

## 2020-12-25 NOTE — Telephone Encounter (Signed)
Patient will need an appointment for further refills.Courtesy refill. Requested Prescriptions  Pending Prescriptions Disp Refills  . pantoprazole (PROTONIX) 20 MG tablet [Pharmacy Med Name: PANTOPRAZOLE SOD DR 20 MG TAB] 30 tablet 0    Sig: TAKE 1 TABLET BY MOUTH EVERY DAY     Gastroenterology: Proton Pump Inhibitors Failed - 12/25/2020  1:22 AM      Failed - Valid encounter within last 12 months    Recent Outpatient Visits          1 year ago Essential hypertension   [facility] [place], Dionne Bucy, MD   1 year ago Essential hypertension   [place], Dionne Bucy, MD   2 years ago Encounter for annual physical exam   [facility] [place], Dionne Bucy, MD   2 years ago Essential hypertension   [place], Dionne Bucy, MD   3 years ago Essential hypertension   [facility] [place], Dionne Bucy, MD

## 2021-01-20 ENCOUNTER — Other Ambulatory Visit: Payer: Self-pay | Admitting: Family Medicine

## 2021-01-20 NOTE — Telephone Encounter (Signed)
Requested medication (s) are due for refill today - yes  Requested medication (s) are on the active medication list -yes  Future visit scheduled -no  Last refill: 12/25/20 #30  Notes to clinic: Request RF: Patient has had courtesy RF, fails appointment protocol  Requested Prescriptions  Pending Prescriptions Disp Refills   pantoprazole (PROTONIX) 20 MG tablet [Pharmacy Med Name: PANTOPRAZOLE SOD DR 20 MG TAB] 30 tablet 0    Sig: TAKE 1 TABLET BY MOUTH EVERY DAY     Gastroenterology: Proton Pump Inhibitors Failed - 01/20/2021 10:30 AM      Failed - Valid encounter within last 12 months    Recent Outpatient Visits           1 year ago Essential hypertension   [facility] [place], Dionne Bucy, MD   1 year ago Essential hypertension   [place], Dionne Bucy, MD   2 years ago Encounter for annual physical exam   [facility] [place], Dionne Bucy, MD   2 years ago Essential hypertension   [facility] [place], Dionne Bucy, MD   3 years ago Essential hypertension   [facility] [place], Dionne Bucy, MD                 Requested Prescriptions  Pending Prescriptions Disp Refills   pantoprazole (PROTONIX) 20 MG tablet [Pharmacy Med Name: PANTOPRAZOLE SOD DR 20 MG TAB] 30 tablet 0    Sig: TAKE 1 TABLET BY MOUTH EVERY DAY     Gastroenterology: Proton Pump Inhibitors Failed - 01/20/2021 10:30 AM      Failed - Valid encounter within last 12 months    Recent Outpatient Visits           1 year ago Essential hypertension   [facility] [place], Dionne Bucy, MD   1 year ago Essential hypertension   [place], Dionne Bucy, MD   2 years ago Encounter for annual physical exam   [facility] [place], Dionne Bucy, MD   2 years ago Essential hypertension   [place], Dionne Bucy, MD   3 years ago Essential hypertension    [facility] [place], Dionne Bucy, MD

## 2021-01-20 NOTE — Telephone Encounter (Signed)
Refill if appropriate.  Please advise.  

## 2021-01-28 ENCOUNTER — Other Ambulatory Visit: Payer: Self-pay | Admitting: Family Medicine

## 2021-02-15 ENCOUNTER — Other Ambulatory Visit: Payer: Self-pay | Admitting: Family Medicine

## 2021-02-15 NOTE — Telephone Encounter (Signed)
Bupropion:12/03/19 #45 3 RF  Metoprolol: 12/03/19 #90 3 RF  Escitalopram: 12/03/19 #90  3 RF   6 months overdue. Sent pt MyChart message and will forward to [facility].  Requested Prescriptions  Pending Prescriptions Disp Refills   buPROPion (WELLBUTRIN) 75 MG tablet [Pharmacy Med Name: BUPROPION HCL 75 MG TABLET] 45 tablet 3    Sig: TAKE 1/2 TABLETS (37.5 MG TOTAL) BY MOUTH DAILY.     Psychiatry: Antidepressants - bupropion Failed - 02/15/2021  9:17 AM      Failed - Valid encounter within last 6 months    Recent Outpatient Visits           1 year ago Essential hypertension   [facility] [place], Dionne Bucy, MD   1 year ago Essential hypertension   [place], Dionne Bucy, MD   2 years ago Encounter for annual physical exam   [facility] [place], Dionne Bucy, MD   2 years ago Essential hypertension   [facility] [place], Dionne Bucy, MD   3 years ago Essential hypertension   [place], Dionne Bucy, MD              Passed - Last BP in normal range    BP Readings from Last 1 Encounters:  12/03/19 135/87           metoprolol succinate (TOPROL-XL) 100 MG 24 hr tablet [Pharmacy Med Name: METOPROLOL SUCC ER 100 MG TAB] 90 tablet 3    Sig: TAKE 1 TABLET BY MOUTH EVERY DAY     Cardiovascular:  Beta Blockers Failed - 02/15/2021  9:17 AM      Failed - Valid encounter within last 6 months    Recent Outpatient Visits           1 year ago Essential hypertension   [facility] [place], Dionne Bucy, MD   1 year ago Essential hypertension   [place], Dionne Bucy, MD   2 years ago Encounter for annual physical exam   [facility], Dionne Bucy, MD   2 years ago Essential hypertension   [facility] [place], Dionne Bucy, MD   3 years ago Essential hypertension   [facility] [place], Dionne Bucy, MD               Passed - Last BP in normal range    BP Readings from Last 1 Encounters:  12/03/19 135/87          Passed - Last Heart Rate in normal range    Pulse Readings from Last 1 Encounters:  10/27/18 65           escitalopram (LEXAPRO) 10 MG tablet [Pharmacy Med Name: ESCITALOPRAM 10 MG TABLET] 90 tablet 3    Sig: TAKE 1 TABLET BY MOUTH EVERY DAY     Psychiatry:  Antidepressants - SSRI Failed - 02/15/2021  9:17 AM      Failed - Valid encounter within last 6 months    Recent Outpatient Visits           1 year ago Essential hypertension   [facility] [place], Dionne Bucy, MD   1 year ago Essential hypertension   [place], Dionne Bucy, MD   2 years ago Encounter for annual physical exam   [facility] [place], Dionne Bucy, MD   2 years ago Essential hypertension   [facility] [place], Dionne Bucy, MD  3 years ago Essential hypertension   [facility] [place], Dionne Bucy, MD

## 2021-02-16 ENCOUNTER — Telehealth: Payer: Self-pay

## 2021-02-16 DIAGNOSIS — R739 Hyperglycemia, unspecified: Secondary | ICD-10-CM

## 2021-02-16 DIAGNOSIS — I10 Essential (primary) hypertension: Secondary | ICD-10-CM

## 2021-02-16 DIAGNOSIS — E782 Mixed hyperlipidemia: Secondary | ICD-10-CM

## 2021-02-16 NOTE — Addendum Note (Signed)
Addended by: Shawna Orleans on: 02/16/2021 01:26 PM   Modules accepted: Orders

## 2021-02-16 NOTE — Telephone Encounter (Signed)
Copied from [place] [phone]. Topic: General - Other >> Feb 16, 2021  8:10 AM Valere Dross wrote: Reason for CRM: Pt called in stating was suppose to get some labs done, and requested if the labs could be ordered and someone reach out to her when they are ready for her to come, please advise.

## 2021-02-16 NOTE — Telephone Encounter (Signed)
Does she just want her labs done before her visit so we can review at that visit? If so, she needs CMP, lipid, A1c for HTN and prediabetes. Ok to order. Needs to have drawn at least 2 days before appt.

## 2021-02-17 NOTE — Telephone Encounter (Signed)
Patient advised.

## 2021-02-26 ENCOUNTER — Other Ambulatory Visit: Payer: Self-pay | Admitting: Family Medicine

## 2021-02-26 NOTE — Telephone Encounter (Signed)
Requested Prescriptions  Pending Prescriptions Disp Refills   pantoprazole (PROTONIX) 20 MG tablet [Pharmacy Med Name: PANTOPRAZOLE SOD DR 20 MG TAB] 30 tablet 0    Sig: TAKE 1 TABLET BY MOUTH EVERY DAY     Gastroenterology: Proton Pump Inhibitors Failed - 02/26/2021  2:34 PM      Failed - Valid encounter within last 12 months    Recent Outpatient Visits          1 year ago Essential hypertension   [facility] [place], Dionne Bucy, MD   1 year ago Essential hypertension   [place], Dionne Bucy, MD   2 years ago Encounter for annual physical exam   [facility] [place], Dionne Bucy, MD   2 years ago Essential hypertension   [place], Dionne Bucy, MD   3 years ago Essential hypertension   [place], Dionne Bucy, MD      Future Appointments            In 1 week Bac[place], Dionne Bucy, MD [facility] [place]

## 2021-03-03 LAB — COMPREHENSIVE METABOLIC PANEL
ALT: 20 IU/L (ref 0–32)
AST: 15 IU/L (ref 0–40)
Albumin/Globulin Ratio: 1.7 (ref 1.2–2.2)
Albumin: 4.2 g/dL (ref 3.8–4.8)
Alkaline Phosphatase: 87 IU/L (ref 44–121)
BUN/Creatinine Ratio: 25 — ABNORMAL HIGH (ref 9–23)
BUN: 16 mg/dL (ref 6–24)
Bilirubin Total: 0.6 mg/dL (ref 0.0–1.2)
CO2: 26 mmol/L (ref 20–29)
Calcium: 9.6 mg/dL (ref 8.7–10.2)
Chloride: 103 mmol/L (ref 96–106)
Creatinine, Ser: 0.65 mg/dL (ref 0.57–1.00)
Globulin, Total: 2.5 g/dL (ref 1.5–4.5)
Glucose: 101 mg/dL — ABNORMAL HIGH (ref 70–99)
Potassium: 4.7 mmol/L (ref 3.5–5.2)
Sodium: 141 mmol/L (ref 134–144)
Total Protein: 6.7 g/dL (ref 6.0–8.5)
eGFR: 107 mL/min/{1.73_m2} (ref >59)

## 2021-03-03 LAB — HEMOGLOBIN A1C
Est. average glucose Bld gHb Est-mCnc: 126 mg/dL
Hgb A1c MFr Bld: 6 % — ABNORMAL HIGH (ref 4.8–5.6)

## 2021-03-03 LAB — LIPID PANEL WITH LDL/HDL RATIO
Cholesterol, Total: 225 mg/dL — ABNORMAL HIGH (ref 100–199)
HDL: 40 mg/dL (ref >39)
LDL Chol Calc (NIH): 159 mg/dL — ABNORMAL HIGH (ref 0–99)
LDL/HDL Ratio: 4 ratio — ABNORMAL HIGH (ref 0.0–3.2)
Triglycerides: 143 mg/dL (ref 0–149)
VLDL Cholesterol Cal: 26 mg/dL (ref 5–40)

## 2021-03-04 ENCOUNTER — Other Ambulatory Visit: Payer: Self-pay | Admitting: Family Medicine

## 2021-03-04 NOTE — Telephone Encounter (Signed)
Requested medication (s) are due for refill today: yes  Requested medication (s) are on the active medication list: yes  Last refill:  01/28/21 #30 for 0 RF   Future visit scheduled: tomorrow  Notes to clinic:  Pt not seen since 2021, please assess at appt tomorrow.   Requested Prescriptions  Pending Prescriptions Disp Refills   hydrochlorothiazide (HYDRODIURIL) 25 MG tablet [Pharmacy Med Name: HYDROCHLOROTHIAZIDE 25 MG TAB] 30 tablet 0    Sig: TAKE 1 TABLET BY MOUTH EVERY DAY     Cardiovascular: Diuretics - Thiazide Failed - 03/04/2021 12:30 PM      Failed - Valid encounter within last 6 months    Recent Outpatient Visits           1 year ago Essential hypertension   [facility] [place], Dionne Bucy, MD   1 year ago Essential hypertension   [place], Dionne Bucy, MD   2 years ago Encounter for annual physical exam   [facility] [place], Dionne Bucy, MD   2 years ago Essential hypertension   [place], Dionne Bucy, MD   3 years ago Essential hypertension   [place], Dionne Bucy, MD       Future Appointments             Tomorrow Brita Romp, Dionne Bucy, MD [facility]            Passed - Cr in normal range and within 180 days    Creatinine, Ser  Date Value Ref Range Status  03/02/2021 0.65 0.57 - 1.00 mg/dL Final          Passed - K in normal range and within 180 days    Potassium  Date Value Ref Range Status  03/02/2021 4.7 3.5 - 5.2 mmol/L Final          Passed - Na in normal range and within 180 days    Sodium  Date Value Ref Range Status  03/02/2021 141 134 - 144 mmol/L Final          Passed - Last BP in normal range    BP Readings from Last 1 Encounters:  12/03/19 135/87

## 2021-03-05 ENCOUNTER — Encounter: Payer: Self-pay | Admitting: Family Medicine

## 2021-03-05 ENCOUNTER — Other Ambulatory Visit: Payer: Self-pay

## 2021-03-05 ENCOUNTER — Telehealth (INDEPENDENT_AMBULATORY_CARE_PROVIDER_SITE_OTHER): Payer: BC Managed Care – PPO | Admitting: Family Medicine

## 2021-03-05 VITALS — BP 129/83 | HR 69 | Ht 63.5 in

## 2021-03-05 DIAGNOSIS — E782 Mixed hyperlipidemia: Secondary | ICD-10-CM

## 2021-03-05 DIAGNOSIS — I10 Essential (primary) hypertension: Secondary | ICD-10-CM | POA: Diagnosis not present

## 2021-03-05 DIAGNOSIS — F411 Generalized anxiety disorder: Secondary | ICD-10-CM | POA: Diagnosis not present

## 2021-03-05 DIAGNOSIS — R7303 Prediabetes: Secondary | ICD-10-CM

## 2021-03-05 DIAGNOSIS — K219 Gastro-esophageal reflux disease without esophagitis: Secondary | ICD-10-CM

## 2021-03-05 MED ORDER — HYDROCHLOROTHIAZIDE 25 MG PO TABS: 25.0000 mg | ORAL_TABLET | Freq: Every day | ORAL | 1 refills | Status: DC

## 2021-03-05 MED ORDER — PANTOPRAZOLE SODIUM 20 MG PO TBEC: 20.0000 mg | DELAYED_RELEASE_TABLET | Freq: Every day | ORAL | 3 refills | Status: DC

## 2021-03-05 MED ORDER — ESCITALOPRAM OXALATE 10 MG PO TABS: 10.0000 mg | ORAL_TABLET | Freq: Every day | ORAL | 1 refills | Status: DC

## 2021-03-05 MED ORDER — BUPROPION HCL 75 MG PO TABS: 37.5000 mg | ORAL_TABLET | Freq: Every day | ORAL | 1 refills | Status: DC

## 2021-03-05 NOTE — Assessment & Plan Note (Signed)
Well controlled Continue current medications Reviewed recent metabolic panel F/u in 6 months  

## 2021-03-05 NOTE — Progress Notes (Signed)
MyChart Video Visit    Virtual Visit via Video Note   This visit type was conducted due to national recommendations for restrictions regarding the COVID-19 Pandemic (e.g. social distancing) in an effort to limit this patient's exposure and mitigate transmission in our community. This patient is at least at moderate risk for complications without adequate follow up. This format is felt to be most appropriate for this patient at this time. Physical exam was limited by quality of the video and audio technology used for the visit.    Patient location: home Provider location: [place] involved in the visit: patient, provider  I discussed the limitations of evaluation and management by telemedicine and the availability of in person appointments. The patient expressed understanding and agreed to proceed.  Patient: Janice Gutierrez   DOB: January 23, 1971   51 y.o. Female  MRN: [id number] Visit Date: 03/05/2021  Today's healthcare provider: Lavon Paganini, MD    I,Joseline E Rosas,acting as a scribe for Lavon Paganini, MD.,have documented all relevant documentation on the behalf of Lavon Paganini, MD,as directed by  Lavon Paganini, MD while in the presence of Lavon Paganini, MD.   Chief Complaint  Patient presents with   Follow-up   Subjective    HPI  Hypertension, follow-up  BP Readings from Last 3 Encounters:  03/05/21 129/83  12/03/19 135/87  04/26/19 136/83   Wt Readings from Last 3 Encounters:  10/27/18 197 lb 9.6 oz (89.6 kg)  08/30/17 198 lb (89.8 kg)     She was last seen for hypertension a few months ago.  BP at that visit was 135/87. Management since that visit includes continue current medications. HCTZ, Metoprolol.  She reports excellent compliance with treatment. She is not having side effects.  She is following a Low Sodium diet.  Outside blood pressures are 136'-120's/90-80's Symptoms: No chest pain No chest pressure  No  palpitations No syncope  No dyspnea No orthopnea  No paroxysmal nocturnal dyspnea No lower extremity edema   Pertinent labs: Lab Results  Component Value Date   CHOL 225 (H) 03/02/2021   HDL 40 03/02/2021   LDLCALC 159 (H) 03/02/2021   TRIG 143 03/02/2021   CHOLHDL 3.9 12/04/2019   Lab Results  Component Value Date   NA 141 03/02/2021   K 4.7 03/02/2021   CREATININE 0.65 03/02/2021   EGFR 107 03/02/2021   GLUCOSE 101 (H) 03/02/2021   TSH 1.430 10/27/2018     The 10-year ASCVD risk score (Arnett DK, et al., 2019) is: 3%   ---------------------------------------------------------------------------------------------------  Anxiety: Lexapro and Wellbutrin are controlling anxiety well. GAD 7 : Generalized Anxiety Score 03/05/2021 04/26/2019 05/24/2018  Nervous, Anxious, on Edge 0 0 0  Control/stop worrying 0 0 0  Worry too much - different things 1 0 0  Trouble relaxing 0 0 0  Restless 0 1 0  Easily annoyed or irritable 0 0 0  Afraid - awful might happen 1 0 0  Total GAD 7 Score 2 1 0  Anxiety Difficulty Not difficult at all Not difficult at all Not difficult at all   GERD: Per patient doing well on the Pantoprazole.   Medications: Outpatient Medications Prior to Visit  Medication Sig   aspirin EC 81 MG tablet Take 1 tablet by mouth daily.   KRILL OIL PO Take by mouth.   metoprolol succinate (TOPROL-XL) 100 MG 24 hr tablet TAKE 1 TABLET BY MOUTH EVERY DAY   [DISCONTINUED] buPROPion (WELLBUTRIN) 75 MG tablet TAKE 1/2  TABLETS (37.5 MG TOTAL) BY MOUTH DAILY.   [DISCONTINUED] escitalopram (LEXAPRO) 10 MG tablet TAKE 1 TABLET BY MOUTH EVERY DAY   [DISCONTINUED] hydrochlorothiazide (HYDRODIURIL) 25 MG tablet TAKE 1 TABLET BY MOUTH EVERY DAY   [DISCONTINUED] pantoprazole (PROTONIX) 20 MG tablet TAKE 1 TABLET BY MOUTH EVERY DAY   [DISCONTINUED] cetirizine (ZYRTEC) 10 MG tablet Take 10 mg by mouth daily.   No facility-administered medications prior to visit.    Review of Systems  per HPI    Objective    BP 129/83 (BP Location: Left Arm, Patient Position: Sitting, Cuff Size: Normal)    Pulse 69    Ht 5' 3.5" (1.613 m)    BMI 34.45 kg/m    Physical Exam Constitutional:      General: She is not in acute distress.    Appearance: Normal appearance.  HENT:     Head: Normocephalic.  Pulmonary:     Effort: Pulmonary effort is normal. No respiratory distress.  Neurological:     Mental Status: She is alert and oriented to person, place, and time. Mental status is at baseline.       Assessment & Plan     Problem List Items Addressed This Visit       Cardiovascular and Mediastinum   Essential hypertension - Primary    Well controlled Continue current medications Reviewed recent metabolic panel F/u in 6 months       Relevant Medications   hydrochlorothiazide (HYDRODIURIL) 25 MG tablet     Digestive   Gastroesophageal reflux disease without esophagitis    Well controlled Continue PPI      Relevant Medications   pantoprazole (PROTONIX) 20 MG tablet     Other   Prediabetes    Reviewed A1c Recommend low carb diet      Mixed hyperlipidemia    Reviewed last lipid panel Not currently on a statin Discussed diet and exercise       Relevant Medications   hydrochlorothiazide (HYDRODIURIL) 25 MG tablet   GAD (generalized anxiety disorder)    Chronic and well controlled Continue wellbutrin and lexapro at current dose      Relevant Medications   buPROPion (WELLBUTRIN) 75 MG tablet   escitalopram (LEXAPRO) 10 MG tablet      Return in about 6 months (around 09/02/2021) for chronic disease f/u.     I discussed the assessment and treatment plan with the patient. The patient was provided an opportunity to ask questions and all were answered. The patient agreed with the plan and demonstrated an understanding of the instructions.   The patient was advised to call back or seek an in-person evaluation if the symptoms worsen or if the condition fails to  improve as anticipated.  I, Lavon Paganini, MD, have reviewed all documentation for this visit. The documentation on 03/05/21 for the exam, diagnosis, procedures, and orders are all accurate and complete.   Buelah Rennie, Dionne Bucy, MD, MPH [facility]

## 2021-03-05 NOTE — Assessment & Plan Note (Signed)
Reviewed last lipid panel Not currently on a statin Discussed diet and exercise 

## 2021-03-05 NOTE — Assessment & Plan Note (Signed)
Well controlled Continue PPI 

## 2021-03-05 NOTE — Assessment & Plan Note (Signed)
Chronic and well controlled Continue wellbutrin and lexapro at current dose

## 2021-03-05 NOTE — Assessment & Plan Note (Signed)
Reviewed A1c Recommend low carb diet

## 2021-03-09 ENCOUNTER — Encounter: Payer: Self-pay | Admitting: Family Medicine

## 2021-03-10 ENCOUNTER — Encounter: Payer: Self-pay | Admitting: Family Medicine

## 2021-03-11 NOTE — Telephone Encounter (Signed)
[facility] to request mammogram.

## 2021-03-13 ENCOUNTER — Encounter: Payer: Self-pay | Admitting: Family Medicine

## 2021-05-15 ENCOUNTER — Other Ambulatory Visit: Payer: Self-pay | Admitting: Family Medicine

## 2021-07-17 ENCOUNTER — Encounter: Payer: Self-pay | Admitting: Family Medicine

## 2021-07-17 ENCOUNTER — Telehealth: Payer: Self-pay

## 2021-07-17 NOTE — Telephone Encounter (Signed)
Patient was responded via mychart. Please see notes

## 2021-07-17 NOTE — Telephone Encounter (Signed)
Copied from [place] [id number]. Topic: Appointment Scheduling - Scheduling Inquiry for Clinic >> Jul 17, 2021  2:07 PM Rudene Anda wrote: Reason for CRM: Pt called in stating she was advised she could be seen today 6/16 for a tick bite, please advise.

## 2021-11-01 ENCOUNTER — Other Ambulatory Visit: Payer: Self-pay | Admitting: Family Medicine

## 2021-11-16 ENCOUNTER — Other Ambulatory Visit: Payer: Self-pay | Admitting: Family Medicine

## 2021-12-04 ENCOUNTER — Other Ambulatory Visit: Payer: Self-pay

## 2021-12-04 DIAGNOSIS — E782 Mixed hyperlipidemia: Secondary | ICD-10-CM

## 2021-12-04 DIAGNOSIS — R7303 Prediabetes: Secondary | ICD-10-CM

## 2021-12-04 DIAGNOSIS — I10 Essential (primary) hypertension: Secondary | ICD-10-CM

## 2021-12-07 ENCOUNTER — Encounter: Payer: Self-pay | Admitting: Family Medicine

## 2021-12-10 NOTE — Progress Notes (Signed)
I,Sulibeya S Dimas,acting as a Education administrator for Lavon Paganini, MD.,have documented all relevant documentation on the behalf of Lavon Paganini, MD,as directed by  Lavon Paganini, MD while in the presence of Lavon Paganini, MD.   MyChart Video Visit    Virtual Visit via Video Note   This visit type was conducted due to national recommendations for restrictions regarding the COVID-19 Pandemic (e.g. social distancing) in an effort to limit this patient's exposure and mitigate transmission in our community. This patient is at least at moderate risk for complications without adequate follow up. This format is felt to be most appropriate for this patient at this time. Physical exam was limited by quality of the video and audio technology used for the visit.    Patient location: home Provider location: [place] involved in the visit: patient, provider   I discussed the limitations of evaluation and management by telemedicine and the availability of in person appointments. The patient expressed understanding and agreed to proceed.  Patient: Janice Gutierrez   DOB: August 12, 1970   51 y.o. Female  MRN: [id number] Visit Date: 12/11/2021  Today's healthcare provider: Lavon Paganini, MD   Chief Complaint  Patient presents with   Hypertension   Subjective    HPI  Patient reports she never get flu vaccine.  She reports she will get labs done first of February. She reports her insurance covers only once a year. Explained the difference between screening and chronic f/u labs - she may have them done next week.   Hypertension, follow-up  BP Readings from Last 3 Encounters:  12/11/21 130/76  03/05/21 129/83  12/03/19 135/87   Wt Readings from Last 3 Encounters:  10/27/18 197 lb 9.6 oz (89.6 kg)  08/30/17 198 lb (89.8 kg)     She was last seen for hypertension 9 months ago.  BP at that visit was 129/83. Management since that visit includes no changes.  She  reports excellent compliance with treatment. She is not having side effects.  She is following a Regular diet. She is exercising. She does not smoke.  Use of agents associated with hypertension: none.   Outside blood pressures are stable. Symptoms: No chest pain No chest pressure  No palpitations No syncope  No dyspnea No orthopnea  No paroxysmal nocturnal dyspnea No lower extremity edema   Pertinent labs Lab Results  Component Value Date   CHOL 225 (H) 03/02/2021   HDL 40 03/02/2021   LDLCALC 159 (H) 03/02/2021   TRIG 143 03/02/2021   CHOLHDL 3.9 12/04/2019   Lab Results  Component Value Date   NA 141 03/02/2021   K 4.7 03/02/2021   CREATININE 0.65 03/02/2021   EGFR 107 03/02/2021   GLUCOSE 101 (H) 03/02/2021   TSH 1.430 10/27/2018     The 10-year ASCVD risk score (Arnett DK, et al., 2019) is: 3.2%  --------------------------------------------------------------------------------------------------- Anxiety, Follow-up  She was last seen for anxiety 9 months ago. Changes made at last visit include no changes.   She reports excellent compliance with treatment. She reports excellent tolerance of treatment. She is not having side effects.   She feels her anxiety is mild and Improved since last visit.  Symptoms: No chest pain No difficulty concentrating  No dizziness No fatigue  No feelings of losing control No insomnia  No irritable No palpitations  No panic attacks No racing thoughts  No shortness of breath No sweating  No tremors/shakes    GAD-7 Results    12/11/2021  9:01 AM 03/05/2021    2:40 PM 04/26/2019    8:37 AM  GAD-7 Generalized Anxiety Disorder Screening Tool  1. Feeling Nervous, Anxious, or on Edge 0 0 0  2. Not Being Able to Stop or Control Worrying 0 0 0  3. Worrying Too Much About Different Things 0 1 0  4. Trouble Relaxing 0 0 0  5. Being So Restless it's Hard To Sit Still 0 0 1  6. Becoming Easily Annoyed or Irritable 0 0 0  7. Feeling  Afraid As If Something Awful Might Happen 0 1 0  Total GAD-7 Score 0 2 1  Difficulty At Work, Home, or Getting  Along With Others? Not difficult at all Not difficult at all Not difficult at all    PHQ-9 Scores    12/11/2021    9:00 AM 03/05/2021    2:39 PM 04/26/2019    8:38 AM  PHQ9 SCORE ONLY  PHQ-9 Total Score 0 0 1    ---------------------------------------------------------------------------------------------------  Is working on home rehab for R plantar fasciitis. Wearing supportive shoes all the time.   Medications: Outpatient Medications Prior to Visit  Medication Sig   aspirin EC 81 MG tablet Take 1 tablet by mouth daily.   [DISCONTINUED] buPROPion (WELLBUTRIN) 75 MG tablet Take 0.5 tablets (37.5 mg total) by mouth daily. Please schedule office visit before any future refill.   [DISCONTINUED] escitalopram (LEXAPRO) 10 MG tablet Take 1 tablet (10 mg total) by mouth daily.   [DISCONTINUED] hydrochlorothiazide (HYDRODIURIL) 25 MG tablet Take 1 tablet (25 mg total) by mouth daily.   [DISCONTINUED] metoprolol succinate (TOPROL-XL) 100 MG 24 hr tablet Take 1 tablet (100 mg total) by mouth daily. Please schedule office visit before any future refill.   [DISCONTINUED] pantoprazole (PROTONIX) 20 MG tablet Take 1 tablet (20 mg total) by mouth daily.   [DISCONTINUED] KRILL OIL PO Take by mouth.   No facility-administered medications prior to visit.    Review of Systems  Constitutional:  Negative for appetite change and fatigue.  Respiratory:  Negative for chest tightness and shortness of breath.   Cardiovascular:  Negative for chest pain, palpitations and leg swelling.  Gastrointestinal:  Negative for abdominal pain, nausea and vomiting.       Objective    BP 130/76 (BP Location: Left Arm, Patient Position: Sitting, Cuff Size: Normal)   Pulse 62      Physical Exam Constitutional:      General: She is not in acute distress.    Appearance: Normal appearance.  HENT:      Head: Normocephalic.  Pulmonary:     Effort: Pulmonary effort is normal. No respiratory distress.  Neurological:     Mental Status: She is alert and oriented to person, place, and time. Mental status is at baseline.        Assessment & Plan     Problem List Items Addressed This Visit       Cardiovascular and Mediastinum   Essential hypertension - Primary    Well controlled Continue current medications Recheck metabolic panel F/u in 6 months       Relevant Medications   hydrochlorothiazide (HYDRODIURIL) 25 MG tablet   metoprolol succinate (TOPROL-XL) 100 MG 24 hr tablet     Other   Prediabetes    Recommend low carb diet Recheck A1c       Mixed hyperlipidemia    Reviewed last lipid panel Not currently on a statin Recheck FLP and CMP Discussed diet and exercise  Relevant Medications   hydrochlorothiazide (HYDRODIURIL) 25 MG tablet   metoprolol succinate (TOPROL-XL) 100 MG 24 hr tablet   GAD (generalized anxiety disorder)    Chronic and well controlled Continue lexapro and wellbutrin at current doses      Relevant Medications   buPROPion (WELLBUTRIN) 75 MG tablet   escitalopram (LEXAPRO) 10 MG tablet   Other Visit Diagnoses     Encounter for screening for HIV       Relevant Orders   HIV antibody (with reflex)   Need for hepatitis C screening test       Relevant Orders   Hepatitis C Antibody        Return in about 6 months (around 06/11/2022) for CPE.     I discussed the assessment and treatment plan with the patient. The patient was provided an opportunity to ask questions and all were answered. The patient agreed with the plan and demonstrated an understanding of the instructions.   The patient was advised to call back or seek an in-person evaluation if the symptoms worsen or if the condition fails to improve as anticipated.  I, Lavon Paganini, MD, have reviewed all documentation for this visit. The documentation on 12/11/21 for the exam,  diagnosis, procedures, and orders are all accurate and complete.   Hamilton Marinello, Dionne Bucy, MD, MPH [facility]

## 2021-12-11 ENCOUNTER — Encounter: Payer: Self-pay | Admitting: Family Medicine

## 2021-12-11 ENCOUNTER — Telehealth: Payer: BC Managed Care – PPO | Admitting: Family Medicine

## 2021-12-11 VITALS — BP 130/76 | HR 62

## 2021-12-11 DIAGNOSIS — F411 Generalized anxiety disorder: Secondary | ICD-10-CM | POA: Diagnosis not present

## 2021-12-11 DIAGNOSIS — R7303 Prediabetes: Secondary | ICD-10-CM

## 2021-12-11 DIAGNOSIS — Z114 Encounter for screening for human immunodeficiency virus [HIV]: Secondary | ICD-10-CM

## 2021-12-11 DIAGNOSIS — I10 Essential (primary) hypertension: Secondary | ICD-10-CM | POA: Diagnosis not present

## 2021-12-11 DIAGNOSIS — E782 Mixed hyperlipidemia: Secondary | ICD-10-CM

## 2021-12-11 DIAGNOSIS — Z1159 Encounter for screening for other viral diseases: Secondary | ICD-10-CM

## 2021-12-11 MED ORDER — HYDROCHLOROTHIAZIDE 25 MG PO TABS: 25.0000 mg | ORAL_TABLET | Freq: Every day | ORAL | 1 refills | Status: DC

## 2021-12-11 MED ORDER — PANTOPRAZOLE SODIUM 20 MG PO TBEC: 20.0000 mg | DELAYED_RELEASE_TABLET | Freq: Every day | ORAL | 3 refills | Status: DC

## 2021-12-11 MED ORDER — BUPROPION HCL 75 MG PO TABS: 37.5000 mg | ORAL_TABLET | Freq: Every day | ORAL | 1 refills | Status: DC

## 2021-12-11 MED ORDER — METOPROLOL SUCCINATE ER 100 MG PO TB24: 100.0000 mg | ORAL_TABLET | Freq: Every day | ORAL | 1 refills | Status: DC

## 2021-12-11 MED ORDER — ESCITALOPRAM OXALATE 10 MG PO TABS: 10.0000 mg | ORAL_TABLET | Freq: Every day | ORAL | 1 refills | Status: DC

## 2021-12-11 NOTE — Assessment & Plan Note (Signed)
Well controlled Continue current medications Recheck metabolic panel F/u in 6 months  

## 2021-12-11 NOTE — Assessment & Plan Note (Signed)
Chronic and well controlled Continue lexapro and wellbutrin at current doses

## 2021-12-11 NOTE — Assessment & Plan Note (Signed)
Reviewed last lipid panel Not currently on a statin Recheck FLP and CMP Discussed diet and exercise  

## 2021-12-11 NOTE — Assessment & Plan Note (Signed)
Recommend low carb diet °Recheck A1c  °

## 2021-12-24 LAB — COMPREHENSIVE METABOLIC PANEL
ALT: 20 IU/L (ref 0–32)
AST: 16 IU/L (ref 0–40)
Albumin/Globulin Ratio: 1.6 (ref 1.2–2.2)
Albumin: 4.4 g/dL (ref 3.8–4.9)
Alkaline Phosphatase: 92 IU/L (ref 44–121)
BUN/Creatinine Ratio: 21 (ref 9–23)
BUN: 14 mg/dL (ref 6–24)
Bilirubin Total: 0.5 mg/dL (ref 0.0–1.2)
CO2: 25 mmol/L (ref 20–29)
Calcium: 9.8 mg/dL (ref 8.7–10.2)
Chloride: 101 mmol/L (ref 96–106)
Creatinine, Ser: 0.68 mg/dL (ref 0.57–1.00)
Globulin, Total: 2.7 g/dL (ref 1.5–4.5)
Glucose: 97 mg/dL (ref 70–99)
Potassium: 4.6 mmol/L (ref 3.5–5.2)
Sodium: 141 mmol/L (ref 134–144)
Total Protein: 7.1 g/dL (ref 6.0–8.5)
eGFR: 105 mL/min/{1.73_m2} (ref >59)

## 2021-12-24 LAB — HIV ANTIBODY (ROUTINE TESTING W REFLEX): HIV Screen 4th Generation wRfx: NONREACTIVE

## 2021-12-24 LAB — LIPID PANEL WITH LDL/HDL RATIO
Cholesterol, Total: 215 mg/dL — ABNORMAL HIGH (ref 100–199)
HDL: 46 mg/dL (ref >39)
LDL Chol Calc (NIH): 142 mg/dL — ABNORMAL HIGH (ref 0–99)
LDL/HDL Ratio: 3.1 ratio (ref 0.0–3.2)
Triglycerides: 150 mg/dL — ABNORMAL HIGH (ref 0–149)
VLDL Cholesterol Cal: 27 mg/dL (ref 5–40)

## 2021-12-24 LAB — HEMOGLOBIN A1C
Est. average glucose Bld gHb Est-mCnc: 126 mg/dL
Hgb A1c MFr Bld: 6 % — ABNORMAL HIGH (ref 4.8–5.6)

## 2021-12-24 LAB — HEPATITIS C ANTIBODY: Hep C Virus Ab: NONREACTIVE

## 2022-04-29 ENCOUNTER — Other Ambulatory Visit: Payer: Self-pay | Admitting: Family Medicine

## 2022-04-29 NOTE — Telephone Encounter (Signed)
Requested Prescriptions  Pending Prescriptions Disp Refills   escitalopram (LEXAPRO) 10 MG tablet [Pharmacy Med Name: ESCITALOPRAM 10 MG TABLET] 90 tablet 0    Sig: Take 1 tablet (10 mg total) by mouth daily. CALL OFFICE TO SCHEDULE PHYSICAL DUE IN MAY.     Psychiatry:  Antidepressants - SSRI Passed - 04/29/2022  2:06 AM      Passed - Valid encounter within last 6 months    Recent Outpatient Visits           4 months ago Essential hypertension   [place], Dionne Bucy, MD   1 year ago Essential hypertension   [place], Dionne Bucy, MD   2 years ago Essential hypertension   [place], Dionne Bucy, MD   3 years ago Essential hypertension   [place], Dionne Bucy, MD   3 years ago Encounter for annual physical exam   [facility] [place], Dionne Bucy, MD

## 2022-06-04 ENCOUNTER — Other Ambulatory Visit: Payer: Self-pay | Admitting: Family Medicine

## 2022-06-07 NOTE — Telephone Encounter (Signed)
Requested Prescriptions  Pending Prescriptions Disp Refills   metoprolol succinate (TOPROL-XL) 100 MG 24 hr tablet [Pharmacy Med Name: METOPROLOL SUCC ER 100 MG TAB] 90 tablet 0    Sig: TAKE 1 TABLET BY MOUTH EVERY DAY     Cardiovascular:  Beta Blockers Passed - 06/04/2022  4:06 PM      Passed - Last BP in normal range    BP Readings from Last 1 Encounters:  12/11/21 130/76         Passed - Last Heart Rate in normal range    Pulse Readings from Last 1 Encounters:  12/11/21 62         Passed - Valid encounter within last 6 months    Recent Outpatient Visits           5 months ago Essential hypertension   [place] [facility] [place], Marzella Schlein, MD   1 year ago Essential hypertension   [place] [facility] [place], Marzella Schlein, MD   2 years ago Essential hypertension   [place] [facility] [place], Marzella Schlein, MD   3 years ago Essential hypertension   [place] [facility] [place], Marzella Schlein, MD   3 years ago Encounter for annual physical exam   [place] [facility] [place], Marzella Schlein, MD               hydrochlorothiazide (HYDRODIURIL) 25 MG tablet [Pharmacy Med Name: HYDROCHLOROTHIAZIDE 25 MG TAB] 90 tablet 0    Sig: TAKE 1 TABLET (25 MG TOTAL) BY MOUTH DAILY.     Cardiovascular: Diuretics - Thiazide Passed - 06/04/2022  4:06 PM      Passed - Cr in normal range and within 180 days    Creatinine, Ser  Date Value Ref Range Status  12/23/2021 0.68 0.57 - 1.00 mg/dL Final         Passed - K in normal range and within 180 days    Potassium  Date Value Ref Range Status  12/23/2021 4.6 3.5 - 5.2 mmol/L Final         Passed - Na in normal range and within 180 days    Sodium  Date Value Ref Range Status  12/23/2021 141 134 - 144 mmol/L Final         Passed - Last BP in normal range    BP Readings from Last 1 Encounters:  12/11/21 130/76         Passed - Valid encounter  within last 6 months    Recent Outpatient Visits           5 months ago Essential hypertension   [facility] [place], Marzella Schlein, MD   1 year ago Essential hypertension   [place] [facility] [place], Marzella Schlein, MD   2 years ago Essential hypertension   [place] [facility] [place], Marzella Schlein, MD   3 years ago Essential hypertension   [place] [facility] [place], Marzella Schlein, MD   3 years ago Encounter for annual physical exam    [facility] [place], Marzella Schlein, MD               buPROPion [facility]) 75 MG tablet [Pharmacy Med Name: BUPROPION HCL 75 MG TABLET] 45 tablet 0    Sig: TAKE 0.5 TABLETS (37.5 MG TOTAL) BY MOUTH DAILY.     Psychiatry: Antidepressants - bupropion Passed - 06/04/2022  4:06 PM  Passed - Cr in normal range and within 360 days    Creatinine, Ser  Date Value Ref Range Status  12/23/2021 0.68 0.57 - 1.00 mg/dL Final         Passed - AST in normal range and within 360 days    AST  Date Value Ref Range Status  12/23/2021 16 0 - 40 IU/L Final         Passed - ALT in normal range and within 360 days    ALT  Date Value Ref Range Status  12/23/2021 20 0 - 32 IU/L Final         Passed - Last BP in normal range    BP Readings from Last 1 Encounters:  12/11/21 130/76         Passed - Valid encounter within last 6 months    Recent Outpatient Visits           5 months ago Essential hypertension   [place] [facility] [place], Marzella Schlein, MD   1 year ago Essential hypertension   [place] [facility] [place], Marzella Schlein, MD   2 years ago Essential hypertension   [place] [facility] [place], Marzella Schlein, MD   3 years ago Essential hypertension   [place] [facility] [employer], Marzella Schlein, MD   3 years ago Encounter for annual physical exam   [facility] [place], Marzella Schlein, MD

## 2022-07-24 ENCOUNTER — Other Ambulatory Visit: Payer: Self-pay | Admitting: Family Medicine

## 2022-07-26 NOTE — Telephone Encounter (Signed)
Requested medication (s) are due for refill today: Yes  Requested medication (s) are on the active medication list: Yes  Last refill:  04/29/22  Future visit scheduled: No  Notes to clinic:  Left message to call and make appointment.    Requested Prescriptions  Pending Prescriptions Disp Refills   escitalopram (LEXAPRO) 10 MG tablet [Pharmacy Med Name: ESCITALOPRAM 10 MG TABLET] 90 tablet 0    Sig: Take 1 tablet (10 mg total) by mouth daily. CALL OFFICE TO SCHEDULE PHYSICAL DUE IN MAY.     Psychiatry:  Antidepressants - SSRI Failed - 07/24/2022  2:34 PM      Failed - Valid encounter within last 6 months    Recent Outpatient Visits           7 months ago Essential hypertension   [place] [facility] [place], Marzella Schlein, MD   1 year ago Essential hypertension   [facility] [place], Marzella Schlein, MD   2 years ago Essential hypertension   [place] [facility] [place], Marzella Schlein, MD   3 years ago Essential hypertension   [place] [facility] [place], Marzella Schlein, MD   3 years ago Encounter for annual physical exam   [facility] [place], Marzella Schlein, MD

## 2022-08-17 ENCOUNTER — Ambulatory Visit: Payer: BC Managed Care – PPO | Admitting: Family Medicine

## 2022-08-17 ENCOUNTER — Encounter: Payer: Self-pay | Admitting: Family Medicine

## 2022-08-17 VITALS — BP 120/82 | HR 63 | Temp 98.2°F | Resp 13 | Ht 63.5 in

## 2022-08-17 DIAGNOSIS — R7303 Prediabetes: Secondary | ICD-10-CM

## 2022-08-17 DIAGNOSIS — M2560 Stiffness of unspecified joint, not elsewhere classified: Secondary | ICD-10-CM

## 2022-08-17 DIAGNOSIS — L409 Psoriasis, unspecified: Secondary | ICD-10-CM

## 2022-08-17 DIAGNOSIS — M79671 Pain in right foot: Secondary | ICD-10-CM

## 2022-08-17 DIAGNOSIS — E782 Mixed hyperlipidemia: Secondary | ICD-10-CM

## 2022-08-17 DIAGNOSIS — F411 Generalized anxiety disorder: Secondary | ICD-10-CM

## 2022-08-17 DIAGNOSIS — I10 Essential (primary) hypertension: Secondary | ICD-10-CM

## 2022-08-17 NOTE — Assessment & Plan Note (Signed)
Per patient, went to rheumatologist who told her she had OA and dismissed her claims that she could have psoriatic arthritis. She then went to a dermatologist who diagnosed her with psoriasis. She would like a referral to new rheumatologist as joint pains are still present.  Referral to rheumatology

## 2022-08-17 NOTE — Progress Notes (Signed)
Established Patient Office Visit  Subjective   Patient ID: Janice Gutierrez, female    DOB: 1970/12/17  Age: 52 y.o. MRN: [id number]  Chief Complaint  Patient presents with   Medication Refill   Arthritis    Patient states she is still going through pain in her hands and in her body.    Janice Gutierrez is here for medical management of her chronic conditions. She says her blood pressure has been well controlled and her at home readings have been at goal. She has been implementing lifestyle changes and trying to eat healthier (eliminated snacking) in order to better manage her blood sugar and lipid levels. She says her wellbutrin and Lexapro have been going well and she is happy with her current doses. She would like a refill of all her current meds.  She is also requesting a referral to podiatry for R heel pain. She has had heel pain for over a year and initially thought it was plantar fascitis, however using a rolling ball, wearing foot inserts, and NSAIDs have not alleviated the pain. She was previously seen by a podiatrist who said she had a bone in her heel that has eroded through fascia and is in contact with the floor, causing pain.  She would also like a referral for a different rheumatologist. She one she saw previously did not feel like a good fit for her.       Review of Systems  Constitutional:  Negative for malaise/fatigue.  Musculoskeletal:  Positive for joint pain.  Skin:  Negative for rash.  Psychiatric/Behavioral:  The patient is not nervous/anxious.       Objective:     There were no vitals taken for this visit.   Physical Exam Constitutional:      Appearance: Normal appearance.  Cardiovascular:     Rate and Rhythm: Normal rate and regular rhythm.  Pulmonary:     Effort: Pulmonary effort is normal.     Breath sounds: Normal breath sounds.  Musculoskeletal:        General: Tenderness present. No swelling.  Skin:    General: Skin is warm and dry.  Neurological:      General: No focal deficit present.     Mental Status: She is alert.    No results found for any visits on 08/17/22.    The 10-year ASCVD risk score (Arnett DK, et al., 2019) is: 1.8%    Assessment & Plan:   Problem List Items Addressed This Visit       Cardiovascular and Mediastinum   Essential hypertension - Primary    Chronic and well controlled. Her home blood pressure readings have been at goal. BP in office today was 120 / 82.  Continue hydrochlorothiazide  Continue metoprolol       Relevant Orders   Comprehensive metabolic panel   Lipid panel     Musculoskeletal and Integument   Psoriasis    Rash associated with psoriasis is improving. Managed by dermatology. However, joint pain has not improved.  Referral to rheumatology      Relevant Orders   Ambulatory referral to Rheumatology     Other   Prediabetes    Last HbgA1c was checked 11/23, it was 6.0. Patient has been making lifestyle changes regarding diet. Will recheck to assess current blood sugar control.  HbgA1c       Relevant Orders   Hemoglobin A1c   Joint stiffness    Per patient, went to rheumatologist who told her she had  OA and dismissed her claims that she could have psoriatic arthritis. She then went to a dermatologist who diagnosed her with psoriasis. She would like a referral to new rheumatologist as joint pains are still present.  Referral to rheumatology       Relevant Orders   Ambulatory referral to Rheumatology   Mixed hyperlipidemia    Last lipid panel was checked in 11/23, there was high cholesterol, triglycerides, and LDL. Patient has been making lifestyle changes in regards to diet. Will recheck lipid panel and consider starting a statin pending results.  Lipid panel  CMP       Relevant Orders   Comprehensive metabolic panel   Lipid panel   GAD (generalized anxiety disorder)    Well controlled. Patient is doing well and happy with her current medication regimen.  Continue  Lexapro  Continue Wellbutrin      Pain of right heel    Chronic and unrelenting. Has not improved with use of multimodal pain management methods.  Referral to podiatry      Relevant Orders   Ambulatory referral to Podiatry    Return in about 6 months (around 02/17/2023) for CPE.    Janice Gutierrez, Medical Student   Patient seen along with MS3 student Janice Gutierrez. I personally evaluated this patient along with the student, and verified all aspects of the history, physical exam, and medical decision making as documented by the student. I agree with the student's documentation and have made all necessary edits.  Janice Gutierrez, Janice Schlein, MD, MPH [facility]

## 2022-08-17 NOTE — Assessment & Plan Note (Signed)
Last HbgA1c was checked 11/23, it was 6.0. Patient has been making lifestyle changes regarding diet. Will recheck to assess current blood sugar control.  HbgA1c

## 2022-08-17 NOTE — Assessment & Plan Note (Signed)
Well controlled. Patient is doing well and happy with her current medication regimen.  Continue Lexapro  Continue Wellbutrin

## 2022-08-17 NOTE — Assessment & Plan Note (Addendum)
Last lipid panel was checked in 11/23, there was high cholesterol, triglycerides, and LDL. Patient has been making lifestyle changes in regards to diet. Will recheck lipid panel and consider starting a statin pending results.  Lipid panel  CMP

## 2022-08-17 NOTE — Assessment & Plan Note (Signed)
Chronic and well controlled. Her home blood pressure readings have been at goal. BP in office today was 120 / 82.  Continue hydrochlorothiazide  Continue metoprolol

## 2022-08-17 NOTE — Assessment & Plan Note (Signed)
Chronic and unrelenting. Has not improved with use of multimodal pain management methods.  Referral to podiatry

## 2022-08-17 NOTE — Assessment & Plan Note (Addendum)
Rash associated with psoriasis is improving. Managed by dermatology. However, joint pain has not improved.  Referral to rheumatology

## 2022-08-18 LAB — COMPREHENSIVE METABOLIC PANEL
ALT: 23 IU/L (ref 0–32)
AST: 20 IU/L (ref 0–40)
Albumin: 4.3 g/dL (ref 3.8–4.9)
Alkaline Phosphatase: 95 IU/L (ref 44–121)
BUN/Creatinine Ratio: 21 (ref 9–23)
BUN: 13 mg/dL (ref 6–24)
Bilirubin Total: 0.5 mg/dL (ref 0.0–1.2)
CO2: 25 mmol/L (ref 20–29)
Calcium: 9.7 mg/dL (ref 8.7–10.2)
Chloride: 100 mmol/L (ref 96–106)
Creatinine, Ser: 0.61 mg/dL (ref 0.57–1.00)
Globulin, Total: 2.8 g/dL (ref 1.5–4.5)
Glucose: 95 mg/dL (ref 70–99)
Potassium: 4.5 mmol/L (ref 3.5–5.2)
Sodium: 138 mmol/L (ref 134–144)
Total Protein: 7.1 g/dL (ref 6.0–8.5)
eGFR: 108 mL/min/{1.73_m2} (ref >59)

## 2022-08-18 LAB — HEMOGLOBIN A1C
Est. average glucose Bld gHb Est-mCnc: 123 mg/dL
Hgb A1c MFr Bld: 5.9 % — ABNORMAL HIGH (ref 4.8–5.6)

## 2022-08-18 LAB — LIPID PANEL
Chol/HDL Ratio: 4.7 ratio — ABNORMAL HIGH (ref 0.0–4.4)
Cholesterol, Total: 204 mg/dL — ABNORMAL HIGH (ref 100–199)
HDL: 43 mg/dL (ref >39)
LDL Chol Calc (NIH): 133 mg/dL — ABNORMAL HIGH (ref 0–99)
Triglycerides: 156 mg/dL — ABNORMAL HIGH (ref 0–149)
VLDL Cholesterol Cal: 28 mg/dL (ref 5–40)

## 2022-09-01 ENCOUNTER — Ambulatory Visit (INDEPENDENT_AMBULATORY_CARE_PROVIDER_SITE_OTHER): Payer: BC Managed Care – PPO

## 2022-09-01 ENCOUNTER — Encounter: Payer: Self-pay | Admitting: Podiatry

## 2022-09-01 ENCOUNTER — Ambulatory Visit: Payer: BC Managed Care – PPO | Admitting: Podiatry

## 2022-09-01 DIAGNOSIS — M722 Plantar fascial fibromatosis: Secondary | ICD-10-CM

## 2022-09-01 DIAGNOSIS — M79673 Pain in unspecified foot: Secondary | ICD-10-CM

## 2022-09-01 MED ORDER — MELOXICAM 15 MG PO TABS: 15.0000 mg | ORAL_TABLET | Freq: Every day | ORAL | 3 refills | Status: DC

## 2022-09-01 MED ORDER — TRIAMCINOLONE ACETONIDE 40 MG/ML IJ SUSP
20.0000 mg | Freq: Once | INTRAMUSCULAR | Status: AC
Start: 2022-09-01 — End: 2022-09-01
  Administered 2022-09-01: 20 mg

## 2022-09-01 MED ORDER — METHYLPREDNISOLONE 4 MG PO TBPK: ORAL_TABLET | ORAL | 0 refills | Status: DC

## 2022-09-01 NOTE — Progress Notes (Signed)
Subjective:  Patient ID: Janice Gutierrez, female    DOB: 25-Mar-1970,  MRN: [id number] HPI Chief Complaint  Patient presents with   Foot Pain    Plantar heel right - aching x several months, AM pain, tried ice and massaging, tries to wear good supportive shoes-which does help, has some generalized pain throughout body-has a rheumatology appt as well to check all that   New Patient (Initial Visit)    52 y.o. female presents with the above complaint.   ROS: Denies fever chills nausea vomit muscle aches pains calf pain back pain chest pain shortness of breath.  Past Medical History:  Diagnosis Date   Anxiety    Arthritis    Hypertension    Past Surgical History:  Procedure Laterality Date   ABDOMINAL HYSTERECTOMY     knows that ovaries are still present.  Unsure if cervix remains   CHOLECYSTECTOMY     EYE SURGERY      Current Outpatient Medications:    meloxicam (MOBIC) 15 MG tablet, Take 1 tablet (15 mg total) by mouth daily., Disp: 30 tablet, Rfl: 3   methylPREDNISolone (MEDROL DOSEPAK) 4 MG TBPK tablet, 6 day dose pack - take as directed, Disp: 21 tablet, Rfl: 0   aspirin EC 81 MG tablet, Take 1 tablet by mouth daily., Disp: , Rfl:    buPROPion (WELLBUTRIN) 75 MG tablet, TAKE 0.5 TABLETS (37.5 MG TOTAL) BY MOUTH DAILY., Disp: 45 tablet, Rfl: 0   escitalopram (LEXAPRO) 10 MG tablet, TAKE 1 TABLET (10 MG TOTAL) BY MOUTH DAILY. CALL OFFICE TO SCHEDULE PHYSICAL DUE IN MAY., Disp: 90 tablet, Rfl: 0   hydrochlorothiazide (HYDRODIURIL) 25 MG tablet, TAKE 1 TABLET (25 MG TOTAL) BY MOUTH DAILY., Disp: 90 tablet, Rfl: 0   metoprolol succinate (TOPROL-XL) 100 MG 24 hr tablet, TAKE 1 TABLET BY MOUTH EVERY DAY, Disp: 90 tablet, Rfl: 0   pantoprazole (PROTONIX) 20 MG tablet, Take 1 tablet (20 mg total) by mouth daily., Disp: 90 tablet, Rfl: 3  Allergies  Allergen Reactions   Latex    Sulfur Nausea And Vomiting   Review of Systems Objective:  There were no vitals filed for this  visit.  General: Well developed, nourished, in no acute distress, alert and oriented x3   Dermatological: Skin is warm, dry and supple bilateral. Nails x 10 are well maintained; remaining integument appears unremarkable at this time. There are no open sores, no preulcerative lesions, no rash or signs of infection present.  Vascular: Dorsalis Pedis artery and Posterior Tibial artery pedal pulses are 2/4 bilateral with immedate capillary fill time. Pedal hair growth present. No varicosities and no lower extremity edema present bilateral.   Neruologic: Grossly intact via light touch bilateral. Vibratory intact via tuning fork bilateral. Protective threshold with Semmes Wienstein monofilament intact to all pedal sites bilateral. Patellar and Achilles deep tendon reflexes 2+ bilateral. No Babinski or clonus noted bilateral.   Musculoskeletal: No gross boney pedal deformities bilateral. No pain, crepitus, or limitation noted with foot and ankle range of motion bilateral. Muscular strength 5/5 in all groups tested bilateral.  Moderate to severe pain on palpation medial calcaneal tubercle of the right heel.  Gait: Unassisted, Nonantalgic.    Radiographs:  Radiographs taken today demonstrate an osseously mature right foot with good mineralization of bone.  She has no spurring noted.  She has questionable osteoarthritic changes in the PIPJ's and DIPJ's of the toes.  She also has some inflammation of a chronic nature beneath the calcaneus.  This  is inflammation of the plantar fascia at its insertion site.  Assessment & Plan:   Assessment: Pain in limb secondary to chronic intractable Planter fasciitis x 1 year  Plan: Discussed etiology pathology conservative versus surgical therapies at this point started her on methylprednisolone to be followed by meloxicam x 1 week since she is going to rheumatology I do not want that interfere with any of her other tests or findings that they may have evaluate.  I did  inject her right heel plain milligrams Kenalog 5 mg Marcaine point maximal tenderness.  Tolerated procedure well without medications.  Was provided a plantar fascial brace and we discussed appropriate shoe gear stretching exercise ice therapy sugar modifications.       Janice Gutierrez, [place]

## 2022-09-01 NOTE — Patient Instructions (Signed)

## 2022-09-02 ENCOUNTER — Telehealth: Payer: Self-pay | Admitting: Podiatry

## 2022-09-02 NOTE — Telephone Encounter (Signed)
Pt LVM needing assistance with her boot and how to adjust it.    Please advise

## 2022-09-08 ENCOUNTER — Encounter: Payer: Self-pay | Admitting: Family Medicine

## 2022-10-06 ENCOUNTER — Encounter: Payer: Self-pay | Admitting: Podiatry

## 2022-10-06 ENCOUNTER — Ambulatory Visit: Payer: BC Managed Care – PPO | Admitting: Podiatry

## 2022-10-06 DIAGNOSIS — M722 Plantar fascial fibromatosis: Secondary | ICD-10-CM | POA: Diagnosis not present

## 2022-10-06 NOTE — Progress Notes (Signed)
She presents today for follow-up of her Planter fasciitis of her right foot after a years worth of pain.  She states that is doing much better and had progressed to 100% but now has regressed to about 90% she has new [employer] and she states that she thinks that makes a big difference she continues to wear her plantar fascial brace and take her meloxicam.  States that the meloxicam is even helped her hands to some degree.  Objective: Vital signs are stable alert and oriented x 3 there is no erythema edema cellulitis drainage odor much decrease in induration and pain from previous evaluation.  She has minimal or no pain on palpation medial calcaneal tubercles.  Assessment: Resolving plantar fasciitis 90% improved.  Plan: Encouraged her to continue to wear the plantar fascial brace she will continue to take the meloxicam and wear her good shoes.  I will follow-up with her in 6 weeks for any regression if she has had no regression or continues to improve then we will just follow-up with her as needed.

## 2022-10-23 ENCOUNTER — Other Ambulatory Visit: Payer: Self-pay | Admitting: Family Medicine

## 2022-10-26 NOTE — Telephone Encounter (Signed)
Requested Prescriptions  Pending Prescriptions Disp Refills   escitalopram (LEXAPRO) 10 MG tablet [Pharmacy Med Name: ESCITALOPRAM 10 MG TABLET] 90 tablet 1    Sig: TAKE 1 TABLET (10 MG TOTAL) BY MOUTH DAILY. CALL OFFICE TO SCHEDULE PHYSICAL DUE IN MAY.     Psychiatry:  Antidepressants - SSRI Passed - 10/23/2022 12:31 PM      Passed - Valid encounter within last 6 months    Recent Outpatient Visits           2 months ago Essential hypertension   [place] [facility] [place], Marzella Schlein, MD   10 months ago Essential hypertension   [facility] [place], Marzella Schlein, MD   1 year ago Essential hypertension   [place] [facility] [place], Marzella Schlein, MD   2 years ago Essential hypertension   [place] [facility] [place], Marzella Schlein, MD   3 years ago Essential hypertension   [place] [facility] [place], Marzella Schlein, MD       Future Appointments             In 3 months Bacigalupo, Marzella Schlein, MD [facility]

## 2022-10-27 ENCOUNTER — Other Ambulatory Visit: Payer: Self-pay | Admitting: Family Medicine

## 2022-10-27 NOTE — Telephone Encounter (Signed)
Current rx for 12/11/21 #90/3 RF at same pharmacy.   Requested Prescriptions  Pending Prescriptions Disp Refills   pantoprazole (PROTONIX) 20 MG tablet [Pharmacy Med Name: PANTOPRAZOLE SOD DR 20 MG TAB] 90 tablet 3    Sig: TAKE 1 TABLET BY MOUTH EVERY DAY     Gastroenterology: Proton Pump Inhibitors Passed - 10/27/2022  1:59 AM      Passed - Valid encounter within last 12 months    Recent Outpatient Visits           2 months ago Essential hypertension   [place] [place], Marzella Schlein, MD   10 months ago Essential hypertension   [place] [facility] [place], Marzella Schlein, MD   1 year ago Essential hypertension   [place] [facility] [place], Marzella Schlein, MD   2 years ago Essential hypertension   [place] [facility] [place], Marzella Schlein, MD   3 years ago Essential hypertension    [facility] [place], Marzella Schlein, MD       Future Appointments             In 3 months Bacigalupo, Marzella Schlein, MD [facility], [facility]

## 2022-10-31 ENCOUNTER — Other Ambulatory Visit: Payer: Self-pay | Admitting: Family Medicine

## 2022-11-04 ENCOUNTER — Other Ambulatory Visit: Payer: Self-pay | Admitting: Family Medicine

## 2022-11-04 NOTE — Telephone Encounter (Signed)
Requested Prescriptions  Pending Prescriptions Disp Refills   hydrochlorothiazide (HYDRODIURIL) 25 MG tablet [Pharmacy Med Name: HYDROCHLOROTHIAZIDE 25 MG TAB] 90 tablet 0    Sig: TAKE 1 TABLET (25 MG TOTAL) BY MOUTH DAILY.     Cardiovascular: Diuretics - Thiazide Passed - 11/04/2022  1:36 AM      Passed - Cr in normal range and within 180 days    Creatinine, Ser  Date Value Ref Range Status  08/17/2022 0.61 0.57 - 1.00 mg/dL Final         Passed - K in normal range and within 180 days    Potassium  Date Value Ref Range Status  08/17/2022 4.5 3.5 - 5.2 mmol/L Final         Passed - Na in normal range and within 180 days    Sodium  Date Value Ref Range Status  08/17/2022 138 134 - 144 mmol/L Final         Passed - Last BP in normal range    BP Readings from Last 1 Encounters:  08/17/22 120/82         Passed - Valid encounter within last 6 months    Recent Outpatient Visits           2 months ago Essential hypertension   [place] [facility] [place], Marzella Schlein, MD   10 months ago Essential hypertension   [place] [facility] [place], Marzella Schlein, MD   1 year ago Essential hypertension   [facility] [place], Marzella Schlein, MD   2 years ago Essential hypertension   [place] [facility] [place], Marzella Schlein, MD   3 years ago Essential hypertension   [place] [facility] [place], Marzella Schlein, MD       Future Appointments             In 3 months Bacigalupo, Marzella Schlein, MD [facility], [facility]             metoprolol succinate (TOPROL-XL) 100 MG 24 hr tablet [Pharmacy Med Name: METOPROLOL SUCC ER 100 MG TAB] 90 tablet 0    Sig: TAKE 1 TABLET BY MOUTH EVERY DAY     Cardiovascular:  Beta Blockers Passed - 11/04/2022  1:36 AM      Passed - Last BP in normal range    BP Readings from Last 1 Encounters:  08/17/22 120/82         Passed - Last Heart  Rate in normal range    Pulse Readings from Last 1 Encounters:  08/17/22 63         Passed - Valid encounter within last 6 months    Recent Outpatient Visits           2 months ago Essential hypertension   [place] [place], Marzella Schlein, MD   10 months ago Essential hypertension   [place] [facility] [place], Marzella Schlein, MD   1 year ago Essential hypertension   [place] [facility] [place], Marzella Schlein, MD   2 years ago Essential hypertension   [facility] [place], Marzella Schlein, MD   3 years ago Essential hypertension   [facility] [place], Marzella Schlein, MD       Future Appointments             In 3 months Bacigalupo, Marzella Schlein, MD [facility], [facility]

## 2022-11-17 ENCOUNTER — Ambulatory Visit: Payer: BC Managed Care – PPO | Admitting: Podiatry

## 2022-12-25 ENCOUNTER — Encounter: Payer: Self-pay | Admitting: Family Medicine

## 2022-12-27 ENCOUNTER — Other Ambulatory Visit: Payer: Self-pay

## 2022-12-27 DIAGNOSIS — K219 Gastro-esophageal reflux disease without esophagitis: Secondary | ICD-10-CM

## 2022-12-27 MED ORDER — PANTOPRAZOLE SODIUM 20 MG PO TBEC: 20.0000 mg | DELAYED_RELEASE_TABLET | Freq: Every day | ORAL | 3 refills | Status: DC

## 2023-01-29 ENCOUNTER — Other Ambulatory Visit: Payer: Self-pay | Admitting: Family Medicine

## 2023-01-30 ENCOUNTER — Other Ambulatory Visit: Payer: Self-pay | Admitting: Family Medicine

## 2023-02-22 ENCOUNTER — Encounter: Payer: BC Managed Care – PPO | Admitting: Family Medicine

## 2023-02-26 ENCOUNTER — Other Ambulatory Visit: Payer: Self-pay | Admitting: Family Medicine

## 2023-02-28 NOTE — Telephone Encounter (Signed)
Requested Prescriptions  Pending Prescriptions Disp Refills   hydrochlorothiazide (HYDRODIURIL) 25 MG tablet [Pharmacy Med Name: HYDROCHLOROTHIAZIDE 25 MG TAB] 30 tablet 0    Sig: TAKE 1 TABLET (25 MG TOTAL) BY MOUTH DAILY.     Cardiovascular: Diuretics - Thiazide Failed - 02/28/2023  5:55 PM      Failed - Cr in normal range and within 180 days    Creatinine, Ser  Date Value Ref Range Status  08/17/2022 0.61 0.57 - 1.00 mg/dL Final         Failed - K in normal range and within 180 days    Potassium  Date Value Ref Range Status  08/17/2022 4.5 3.5 - 5.2 mmol/L Final         Failed - Na in normal range and within 180 days    Sodium  Date Value Ref Range Status  08/17/2022 138 134 - 144 mmol/L Final         Failed - Valid encounter within last 6 months    Recent Outpatient Visits           6 months ago Essential hypertension   [place] [facility] [place], Marzella Schlein, MD   1 year ago Essential hypertension   [place] [facility] [place], Marzella Schlein, MD   1 year ago Essential hypertension   [place] [facility] [place], Marzella Schlein, MD   3 years ago Essential hypertension   [facility] [place], Marzella Schlein, MD   3 years ago Essential hypertension   [place] [facility] [place], Marzella Schlein, MD       Future Appointments             In 2 weeks Bacigalupo, Marzella Schlein, MD [facility]            Passed - Last BP in normal range    BP Readings from Last 1 Encounters:  08/17/22 120/82

## 2023-03-15 ENCOUNTER — Ambulatory Visit
Admission: RE | Admit: 2023-03-15 | Discharge: 2023-03-15 | Disposition: A | Discharge status: Home / Self Care | Payer: 59 | Attending: Family Medicine | Admitting: Family Medicine

## 2023-03-15 ENCOUNTER — Ambulatory Visit (INDEPENDENT_AMBULATORY_CARE_PROVIDER_SITE_OTHER): Payer: 59 | Admitting: Family Medicine

## 2023-03-15 ENCOUNTER — Ambulatory Visit
Admission: RE | Admit: 2023-03-15 | Discharge: 2023-03-15 | Disposition: A | Discharge status: Home / Self Care | Payer: 59 | Source: Ambulatory Visit | Attending: Family Medicine

## 2023-03-15 ENCOUNTER — Encounter: Payer: Self-pay | Admitting: Family Medicine

## 2023-03-15 VITALS — BP 133/96 | HR 66 | Ht 63.0 in

## 2023-03-15 DIAGNOSIS — E782 Mixed hyperlipidemia: Secondary | ICD-10-CM

## 2023-03-15 DIAGNOSIS — R7303 Prediabetes: Secondary | ICD-10-CM | POA: Diagnosis not present

## 2023-03-15 DIAGNOSIS — I10 Essential (primary) hypertension: Secondary | ICD-10-CM

## 2023-03-15 DIAGNOSIS — G8929 Other chronic pain: Secondary | ICD-10-CM | POA: Diagnosis present

## 2023-03-15 DIAGNOSIS — Z0001 Encounter for general adult medical examination with abnormal findings: Secondary | ICD-10-CM

## 2023-03-15 DIAGNOSIS — M546 Pain in thoracic spine: Secondary | ICD-10-CM

## 2023-03-15 DIAGNOSIS — F411 Generalized anxiety disorder: Secondary | ICD-10-CM

## 2023-03-15 DIAGNOSIS — Z0000 Encounter for general adult medical examination without abnormal findings: Secondary | ICD-10-CM

## 2023-03-15 MED ORDER — ESCITALOPRAM OXALATE 10 MG PO TABS: 5.0000 mg | ORAL_TABLET | Freq: Every day | ORAL | 1 refills | Status: DC

## 2023-03-15 NOTE — Assessment & Plan Note (Signed)
Recommend low carb diet Recheck A1c

## 2023-03-15 NOTE — Assessment & Plan Note (Signed)
Reviewed last lipid panel Not currently on a statin Recheck FLP and CMP Discussed diet and exercise

## 2023-03-15 NOTE — Assessment & Plan Note (Signed)
Chronic hypertension managed with hydrochlorothiazide 25 mg daily and metoprolol XL 100 mg daily. No current issues reported. - Continue current antihypertensive regimen - Monitor blood pressure regularly

## 2023-03-15 NOTE — Progress Notes (Signed)
Complete physical exam   Patient: Janice Gutierrez   DOB: 05/21/1970   53 y.o. Female  MRN: [id number] Visit Date: 03/15/2023  Today's healthcare provider: Shirlee Latch, MD   Chief Complaint  Patient presents with   Annual Exam    Diet - high protein low carb [employer]ise - walking 2-3 weeks for at least 20-30 mins Feeling - well Sleeping - well Concerns - would like to see about xray on back due to stretching a lot due to being stiff. Reports place in back that hurts for a few months now   Care Management    Mammogram scheduled for march at [facility] in [place]   Subjective    Janice Gutierrez is a 53 y.o. female who presents today for a complete physical exam.    Discussed the use of AI scribe software for clinical note transcription with the patient, who gave verbal consent to proceed.  History of Present Illness   The patient, with a history of hypertension and anxiety, presents for a routine physical examination. The patient reports experiencing back pain, particularly when stretching or arching the back. The pain is described as being located in the center of the back and is worse in the morning. The patient has been managing the pain with stretching and the use of a heating pad.  The patient also expresses concern about her cholesterol and A1c levels, and is curious to see if her efforts to improve her health have resulted in lower levels. The patient is due for a mammogram and gynecology appointment, and has these scheduled for later in the month.  The patient is considering getting a shingles shot, and is due for a Cologuard test later in the year. The patient also mentions a previous appointment with a rheumatologist, and expresses confusion about a change in the doctor assigned to her case.        Last depression screening scores    03/15/2023    8:33 AM 08/17/2022    8:12 AM 12/11/2021    9:00 AM  PHQ 2/9 Scores  PHQ - 2 Score 0 1 0  PHQ- 9 Score   0    Last fall risk screening    03/15/2023    8:33 AM  Fall Risk   Falls in the past year? 0  Number falls in past yr: 0  Injury with Fall? 0  Risk for fall due to : No Fall Risks  Follow up Falls evaluation completed        Medications: Outpatient Medications Prior to Visit  Medication Sig   aspirin EC 81 MG tablet Take 1 tablet by mouth daily.   buPROPion (WELLBUTRIN) 75 MG tablet TAKE 0.5 TABLETS (37.5 MG TOTAL) BY MOUTH DAILY.   hydrochlorothiazide (HYDRODIURIL) 25 MG tablet TAKE 1 TABLET (25 MG TOTAL) BY MOUTH DAILY.   metoprolol succinate (TOPROL-XL) 100 MG 24 hr tablet TAKE 1 TABLET BY MOUTH EVERY DAY   pantoprazole (PROTONIX) 20 MG tablet Take 1 tablet (20 mg total) by mouth daily.   [DISCONTINUED] escitalopram (LEXAPRO) 10 MG tablet TAKE 1 TABLET (10 MG TOTAL) BY MOUTH DAILY. CALL OFFICE TO SCHEDULE PHYSICAL DUE IN MAY.   [DISCONTINUED] meloxicam (MOBIC) 15 MG tablet Take 1 tablet (15 mg total) by mouth daily.   No facility-administered medications prior to visit.    Review of Systems    Objective    BP (!) 133/96 (BP Location: Left Arm, Patient Position: Sitting, Cuff Size: Normal)  Pulse 66   Ht 5\' 3"  (1.6 m)   SpO2 95%   BMI 35.00 kg/m    Physical Exam Vitals reviewed.  Constitutional:      General: She is not in acute distress.    Appearance: Normal appearance. She is well-developed. She is not diaphoretic.  HENT:     Head: Normocephalic and atraumatic.     Right Ear: Tympanic membrane, ear canal and external ear normal.     Left Ear: Tympanic membrane, ear canal and external ear normal.     Nose: Nose normal.     Mouth/Throat:     Mouth: Mucous membranes are moist.     Pharynx: Oropharynx is clear. No oropharyngeal exudate.  Eyes:     General: No scleral icterus.    Conjunctiva/sclera: Conjunctivae normal.     Pupils: Pupils are equal, round, and reactive to light.  Neck:     Thyroid: No thyromegaly.  Cardiovascular:     Rate and Rhythm:  Normal rate and regular rhythm.     Heart sounds: Normal heart sounds. No murmur heard. Pulmonary:     Effort: Pulmonary effort is normal. No respiratory distress.     Breath sounds: Normal breath sounds. No wheezing or rales.  Abdominal:     General: There is no distension.     Palpations: Abdomen is soft.     Tenderness: There is no abdominal tenderness.  Musculoskeletal:        General: No deformity.     Cervical back: Neck supple.     Right lower leg: No edema.     Left lower leg: No edema.     Comments: TTP over T spine  Lymphadenopathy:     Cervical: No cervical adenopathy.  Skin:    General: Skin is warm and dry.     Findings: No rash.  Neurological:     Mental Status: She is alert and oriented to person, place, and time. Mental status is at baseline.     Gait: Gait normal.  Psychiatric:        Mood and Affect: Mood normal.        Behavior: Behavior normal.        Thought Content: Thought content normal.      No results found for any visits on 03/15/23.  Assessment & Plan    Routine Health Maintenance and Physical Exam  Exercise Activities and Dietary recommendations  Goals   None     Immunization History  Administered Date(s) Administered   Hep B, Unspecified 06/01/2013   MMR 10/18/1970   Tdap 07/11/2016, 08/27/2016    Health Maintenance  Topic Date Due   Zoster Vaccines- Shingrix (1 of 2) Never done   MAMMOGRAM  04/16/2022   COVID-19 Vaccine (1 - 2024-25 season) Never done   INFLUENZA VACCINE  05/02/2023 (Originally 09/02/2022)   Fecal DNA (Cologuard)  09/28/2023   DTaP/Tdap/Td (3 - Td or Tdap) 08/28/2026   Hepatitis C Screening  Completed   HIV Screening  Completed   HPV VACCINES  Aged Out    Discussed health benefits of physical activity, and encouraged her to engage in regular exercise appropriate for her age and condition.  Problem List Items Addressed This Visit       Cardiovascular and Mediastinum   Essential hypertension   Chronic  hypertension managed with hydrochlorothiazide 25 mg daily and metoprolol XL 100 mg daily. No current issues reported. - Continue current antihypertensive regimen - Monitor blood pressure regularly  Relevant Orders   Comprehensive metabolic panel     Other   Prediabetes   Recommend low carb diet Recheck A1c       Relevant Orders   Hemoglobin A1c   Mixed hyperlipidemia   Reviewed last lipid panel Not currently on a statin Recheck FLP and CMP Discussed diet and exercise       Relevant Orders   Lipid Panel With LDL/HDL Ratio   GAD (generalized anxiety disorder)   Chronic anxiety managed with Lexapro 5 mg daily and Wellbutrin 37.5 mg every other day. Symptoms well-managed with current regimen. Patient has reduced Wellbutrin to every other day without increased anxiety. - Continue current regimen of Lexapro and Wellbutrin - Monitor for any changes in anxiety symptoms      Relevant Medications   escitalopram (LEXAPRO) 10 MG tablet   Other Visit Diagnoses       Encounter for annual physical exam    -  Primary     Chronic midline thoracic back pain       Relevant Medications   escitalopram (LEXAPRO) 10 MG tablet   Other Relevant Orders   DG Thoracic Spine W/Swimmers           Back Pain Chronic back pain with midline tenderness, exacerbated by extension and stretching. No radicular symptoms or neurological deficits. Differential includes muscular strain versus vertebral pathology. Persistent pain despite stretching; sedentary job contributing to stiffness. - Order thoracic spine x-ray - Recommend continued stretching and use of a heating pad for symptomatic relief  General Health Maintenance Routine health maintenance and screening discussed. Mammogram and gynecology appointment scheduled. Cologuard screening due in August. Tetanus vaccination up to date until 2028. Shingles vaccination discussed, including the two-dose schedule and potential side effects. Discussed  effectiveness and limitations of Cologuard, including false positive and false negative rates. - Order Cologuard screening in August - Discuss shingles vaccination and consider scheduling - Continue routine health maintenance and screenings  Follow-up - Schedule six-month follow-up visit - Review lab results and communicate findings via patient portal - Follow up with rheumatology regarding appointment confusion.         Return in about 6 months (around 09/12/2023) for chronic disease f/u.     Janice Latch, MD  [facility] [phone] (phone) [phone] (fax)  [facility]

## 2023-03-15 NOTE — Assessment & Plan Note (Signed)
Chronic anxiety managed with Lexapro 5 mg daily and Wellbutrin 37.5 mg every other day. Symptoms well-managed with current regimen. Patient has reduced Wellbutrin to every other day without increased anxiety. - Continue current regimen of Lexapro and Wellbutrin - Monitor for any changes in anxiety symptoms

## 2023-03-16 ENCOUNTER — Encounter: Payer: Self-pay | Admitting: Family Medicine

## 2023-03-16 LAB — COMPREHENSIVE METABOLIC PANEL
ALT: 20 [IU]/L (ref 0–32)
AST: 19 [IU]/L (ref 0–40)
Albumin: 4.5 g/dL (ref 3.8–4.9)
Alkaline Phosphatase: 92 [IU]/L (ref 44–121)
BUN/Creatinine Ratio: 25 — ABNORMAL HIGH (ref 9–23)
BUN: 17 mg/dL (ref 6–24)
Bilirubin Total: 0.5 mg/dL (ref 0.0–1.2)
CO2: 24 mmol/L (ref 20–29)
Calcium: 10.2 mg/dL (ref 8.7–10.2)
Chloride: 101 mmol/L (ref 96–106)
Creatinine, Ser: 0.69 mg/dL (ref 0.57–1.00)
Globulin, Total: 2.7 g/dL (ref 1.5–4.5)
Glucose: 97 mg/dL (ref 70–99)
Potassium: 5 mmol/L (ref 3.5–5.2)
Sodium: 140 mmol/L (ref 134–144)
Total Protein: 7.2 g/dL (ref 6.0–8.5)
eGFR: 104 mL/min/{1.73_m2} (ref >59)

## 2023-03-16 LAB — HEMOGLOBIN A1C
Est. average glucose Bld gHb Est-mCnc: 134 mg/dL
Hgb A1c MFr Bld: 6.3 % — ABNORMAL HIGH (ref 4.8–5.6)

## 2023-03-16 LAB — LIPID PANEL WITH LDL/HDL RATIO
Cholesterol, Total: 255 mg/dL — ABNORMAL HIGH (ref 100–199)
HDL: 48 mg/dL (ref >39)
LDL Chol Calc (NIH): 178 mg/dL — ABNORMAL HIGH (ref 0–99)
LDL/HDL Ratio: 3.7 {ratio} — ABNORMAL HIGH (ref 0.0–3.2)
Triglycerides: 159 mg/dL — ABNORMAL HIGH (ref 0–149)
VLDL Cholesterol Cal: 29 mg/dL (ref 5–40)

## 2023-03-17 ENCOUNTER — Encounter: Payer: Self-pay | Admitting: Family Medicine

## 2023-03-28 ENCOUNTER — Encounter: Payer: Self-pay | Admitting: Family Medicine

## 2023-04-08 ENCOUNTER — Other Ambulatory Visit: Payer: Self-pay | Admitting: Family Medicine

## 2023-04-08 NOTE — Telephone Encounter (Signed)
 Pt. Has appointment. Requested Prescriptions  Pending Prescriptions Disp Refills   hydrochlorothiazide (HYDRODIURIL) 25 MG tablet [Pharmacy Med Name: HYDROCHLOROTHIAZIDE 25 MG TAB] 30 tablet 0    Sig: TAKE 1 TABLET (25 MG TOTAL) BY MOUTH DAILY.     Cardiovascular: Diuretics - Thiazide Failed - 04/08/2023 12:29 PM      Failed - Last BP in normal range    BP Readings from Last 1 Encounters:  03/15/23 (!) 133/96         Failed - Valid encounter within last 6 months    Recent Outpatient Visits           7 months ago Essential hypertension   [place] [facility] [place], Marzella Schlein, MD   1 year ago Essential hypertension   [place] [facility] [place], Marzella Schlein, MD   2 years ago Essential hypertension   [facility] [place], Marzella Schlein, MD   3 years ago Essential hypertension   [place] [facility] [place], Marzella Schlein, MD   3 years ago Essential hypertension   [place] [facility] [place], Marzella Schlein, MD       Future Appointments             In 5 months Bacigalupo, Marzella Schlein, MD [facility]            Passed - Cr in normal range and within 180 days    Creatinine, Ser  Date Value Ref Range Status  03/15/2023 0.69 0.57 - 1.00 mg/dL Final         Passed - K in normal range and within 180 days    Potassium  Date Value Ref Range Status  03/15/2023 5.0 3.5 - 5.2 mmol/L Final         Passed - Na in normal range and within 180 days    Sodium  Date Value Ref Range Status  03/15/2023 140 134 - 144 mmol/L Final

## 2023-04-27 ENCOUNTER — Other Ambulatory Visit: Payer: Self-pay | Admitting: Family Medicine

## 2023-04-28 LAB — EXTERNAL GENERIC LAB PROCEDURE: COLOGUARD: NEGATIVE

## 2023-04-28 LAB — COLOGUARD: COLOGUARD: NEGATIVE

## 2023-05-01 ENCOUNTER — Other Ambulatory Visit: Payer: Self-pay | Admitting: Family Medicine

## 2023-05-03 NOTE — Telephone Encounter (Signed)
 Rx  03/15/23 #45 1RF- too soon ( change in directions) Requested Prescriptions  Pending Prescriptions Disp Refills   escitalopram (LEXAPRO) 10 MG tablet [Pharmacy Med Name: ESCITALOPRAM 10 MG TABLET] 90 tablet 1    Sig: TAKE 1 TABLET (10 MG TOTAL) BY MOUTH DAILY. CALL OFFICE TO SCHEDULE PHYSICAL DUE IN MAY.     Psychiatry:  Antidepressants - SSRI Passed - 05/03/2023  2:04 PM      Passed - Valid encounter within last 6 months    Recent Outpatient Visits           1 month ago Encounter for annual physical exam   [place] [facility], Janice Schlein, MD       Future Appointments             In 4 months Bacigalupo, Janice Schlein, MD [facility]

## 2023-05-04 ENCOUNTER — Other Ambulatory Visit: Payer: Self-pay | Admitting: Family Medicine

## 2023-05-17 LAB — HM MAMMOGRAPHY

## 2023-05-17 LAB — HM DEXA SCAN

## 2023-06-07 ENCOUNTER — Other Ambulatory Visit: Payer: Self-pay | Admitting: Family Medicine

## 2023-06-08 ENCOUNTER — Encounter: Payer: Self-pay | Admitting: Family Medicine

## 2023-06-08 DIAGNOSIS — R051 Acute cough: Secondary | ICD-10-CM

## 2023-06-08 DIAGNOSIS — Z7729 Contact with and (suspected ) exposure to other hazardous substances: Secondary | ICD-10-CM

## 2023-06-17 LAB — HANTAVIRUS ANTIBODIES, ELISA
Hantavirus IgG: NEGATIVE
Hantavirus IgM: NEGATIVE

## 2023-06-20 ENCOUNTER — Ambulatory Visit: Payer: Self-pay | Admitting: Family Medicine

## 2023-07-27 ENCOUNTER — Other Ambulatory Visit: Payer: Self-pay | Admitting: Family Medicine

## 2023-07-28 ENCOUNTER — Other Ambulatory Visit: Payer: Self-pay | Admitting: Family Medicine

## 2023-09-03 ENCOUNTER — Other Ambulatory Visit: Payer: Self-pay | Admitting: Family Medicine

## 2023-09-12 ENCOUNTER — Ambulatory Visit: Payer: 59 | Admitting: Family Medicine

## 2023-09-12 ENCOUNTER — Encounter: Payer: Self-pay | Admitting: Family Medicine

## 2023-09-12 ENCOUNTER — Ambulatory Visit: Attending: Family Medicine

## 2023-09-12 VITALS — BP 127/81 | HR 64 | Temp 98.7°F | Resp 16

## 2023-09-12 DIAGNOSIS — R002 Palpitations: Secondary | ICD-10-CM

## 2023-09-12 DIAGNOSIS — L900 Lichen sclerosus et atrophicus: Secondary | ICD-10-CM

## 2023-09-12 DIAGNOSIS — E782 Mixed hyperlipidemia: Secondary | ICD-10-CM | POA: Diagnosis not present

## 2023-09-12 DIAGNOSIS — R7303 Prediabetes: Secondary | ICD-10-CM | POA: Diagnosis not present

## 2023-09-12 DIAGNOSIS — K219 Gastro-esophageal reflux disease without esophagitis: Secondary | ICD-10-CM

## 2023-09-12 DIAGNOSIS — E88819 Insulin resistance, unspecified: Secondary | ICD-10-CM

## 2023-09-12 DIAGNOSIS — F411 Generalized anxiety disorder: Secondary | ICD-10-CM

## 2023-09-12 DIAGNOSIS — I10 Essential (primary) hypertension: Secondary | ICD-10-CM

## 2023-09-12 NOTE — Assessment & Plan Note (Signed)
 Previous cholesterol levels elevated. Awaiting current lab results to assess lipid profile. - Perform lab work including cholesterol levels

## 2023-09-12 NOTE — Assessment & Plan Note (Signed)
 Recently diagnosed with lichen sclerosus confirmed by biopsy. - Continue clobetasol cream as prescribed

## 2023-09-12 NOTE — Assessment & Plan Note (Signed)
 Anxiety well-managed on current medication regimen. - Continue Lexapro  as prescribed

## 2023-09-12 NOTE — Assessment & Plan Note (Signed)
 Insulin  resistance and prediabetes Current fasting glucose levels are within normal limits. A1c will be checked to assess overall glucose control. - Perform lab work including A1c to assess glucose control

## 2023-09-12 NOTE — Assessment & Plan Note (Signed)
 Reflux symptoms improved after discontinuing acidic candies. - Continue pantoprazole  as prescribed

## 2023-09-12 NOTE — Assessment & Plan Note (Signed)
 Blood pressure well-controlled at 127/81 mmHg on current medication regimen. - Continue hydrochlorothiazide  25 mg daily - Continue metoprolol  100 mg daily

## 2023-09-12 NOTE — Progress Notes (Signed)
 Established patient visit   Patient: Janice Gutierrez   DOB: 1970/09/01   53 y.o. Female  MRN: [id number] Visit Date: 09/12/2023  Today's healthcare provider: Jon Eva, MD   Chief Complaint  Patient presents with   Medical Management of Chronic Issues    6 months follow-up and labs. Patient would like to have insulin  levels check. Report that she was just dx with Lichen Sclerosis.   Palpitations    Heart palpitations at rest   Subjective    Palpitations    HPI     Medical Management of Chronic Issues    Additional comments: 6 months follow-up and labs. Patient would like to have insulin  levels check. Report that she was just dx with Lichen Sclerosis.        Palpitations    Additional comments: Heart palpitations at rest      Last edited by Rosas, Joseline E, CMA on 09/12/2023  8:14 AM.       Discussed the use of AI scribe software for clinical note transcription with the patient, who gave verbal consent to proceed.  History of Present Illness   Janice Gutierrez is a 53 year old female with hypertension and anxiety who presents with palpitations.  Palpitations occur primarily in the evenings at rest, described as a sensation of the heart 'beating harder'. These episodes were present for three to four days a couple of weeks ago but have resolved in the past week. There is a potential link between palpitations and the consumption of acidic candies, which also worsened reflux symptoms. Discontinuation of these candies led to improvement in both palpitations and reflux. She experiences tenderness across her back, which she attributes to muscular issues or anxiety.  She takes hydrochlorothiazide  25 mg daily and metoprolol  100 mg daily. Lexapro  is used for anxiety, which is well-controlled. Missing a dose of metoprolol  results in a significantly increased heart rate.         Medications: Outpatient Medications Prior to Visit  Medication Sig   aspirin EC 81 MG  tablet Take 1 tablet by mouth daily.   buPROPion  (WELLBUTRIN ) 75 MG tablet TAKE 0.5 TABLETS (37.5 MG TOTAL) BY MOUTH DAILY.   clobetasol ointment (TEMOVATE) 0.05 % Apply 1 Application topically 2 (two) times daily.   escitalopram  (LEXAPRO ) 10 MG tablet Take 0.5 tablets (5 mg total) by mouth daily.   hydrochlorothiazide  (HYDRODIURIL ) 25 MG tablet TAKE 1 TABLET (25 MG TOTAL) BY MOUTH DAILY.   metoprolol  succinate (TOPROL -XL) 100 MG 24 hr tablet TAKE 1 TABLET BY MOUTH EVERY DAY   pantoprazole  (PROTONIX ) 20 MG tablet Take 1 tablet (20 mg total) by mouth daily.   No facility-administered medications prior to visit.    Review of Systems  Cardiovascular:  Positive for palpitations.       Objective    BP 127/81 (BP Location: Left Arm, Patient Position: Sitting, Cuff Size: Large)   Pulse 64   Temp 98.7 F (37.1 C) (Oral)   Resp 16    Physical Exam Vitals reviewed.  Constitutional:      General: She is not in acute distress.    Appearance: Normal appearance. She is well-developed. She is not diaphoretic.  HENT:     Head: Normocephalic and atraumatic.  Eyes:     General: No scleral icterus.    Conjunctiva/sclera: Conjunctivae normal.  Neck:     Thyroid: No thyromegaly.  Cardiovascular:     Rate and Rhythm: Normal rate and regular rhythm.  Heart sounds: Normal heart sounds. No murmur heard. Pulmonary:     Effort: Pulmonary effort is normal. No respiratory distress.     Breath sounds: Normal breath sounds. No wheezing, rhonchi or rales.  Musculoskeletal:     Cervical back: Neck supple.     Right lower leg: No edema.     Left lower leg: No edema.  Lymphadenopathy:     Cervical: No cervical adenopathy.  Skin:    General: Skin is warm and dry.  Neurological:     Mental Status: She is alert and oriented to person, place, and time. Mental status is at baseline.  Psychiatric:        Mood and Affect: Mood normal.        Behavior: Behavior normal.      No results found for  any visits on 09/12/23.  Assessment & Plan     Problem List Items Addressed This Visit       Cardiovascular and Mediastinum   Essential hypertension   Relevant Orders   Comprehensive metabolic panel with GFR     Digestive   Gastroesophageal reflux disease without esophagitis     Other   Prediabetes   Relevant Orders   Hemoglobin A1c   Insulin  and C-Peptide   Mixed hyperlipidemia   Relevant Orders   Lipid panel   GAD (generalized anxiety disorder)   Lichen sclerosus   Other Visit Diagnoses       Heart palpitations    -  Primary   Relevant Orders   EKG 12-Lead   LONG TERM MONITOR (3-14 DAYS)   Comprehensive metabolic panel with GFR   CBC   TSH     Insulin  resistance       Relevant Orders   Insulin  and C-Peptide           Palpitations Intermittent palpitations primarily in the evenings at rest, subsided over the past week. Possible association with anxiety and dietary factors. Differential includes arrhythmias, electrolyte imbalances, and thyroid dysfunction. 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 - Advise ergonomic adjustments at work, including raising desk height to improve posture - Encourage regular stretching exercises        Return in about 6 months (around 03/14/2024) for CPE.       Jon Eva, MD  [facility] [phone] (phone) [phone] (fax)  [facility]

## 2023-09-15 LAB — COMPREHENSIVE METABOLIC PANEL WITH GFR
ALT: 26 IU/L (ref 0–32)
AST: 20 IU/L (ref 0–40)
Albumin: 4.3 g/dL (ref 3.8–4.9)
Alkaline Phosphatase: 90 IU/L (ref 44–121)
BUN/Creatinine Ratio: 19 (ref 9–23)
BUN: 15 mg/dL (ref 6–24)
Bilirubin Total: <0.2 mg/dL (ref 0.0–1.2)
CO2: 21 mmol/L (ref 20–29)
Calcium: 10.1 mg/dL (ref 8.7–10.2)
Chloride: 99 mmol/L (ref 96–106)
Creatinine, Ser: 0.78 mg/dL (ref 0.57–1.00)
Globulin, Total: 2.6 g/dL (ref 1.5–4.5)
Glucose: 92 mg/dL (ref 70–99)
Potassium: 4.6 mmol/L (ref 3.5–5.2)
Sodium: 140 mmol/L (ref 134–144)
Total Protein: 6.9 g/dL (ref 6.0–8.5)
eGFR: 91 mL/min/1.73 (ref >59)

## 2023-09-15 LAB — TSH: TSH: 1.19 u[IU]/mL (ref 0.450–4.500)

## 2023-09-15 LAB — LIPID PANEL
Chol/HDL Ratio: 5.1 ratio — ABNORMAL HIGH (ref 0.0–4.4)
Cholesterol, Total: 230 mg/dL — ABNORMAL HIGH (ref 100–199)
HDL: 45 mg/dL (ref >39)
LDL Chol Calc (NIH): 158 mg/dL — ABNORMAL HIGH (ref 0–99)
Triglycerides: 148 mg/dL (ref 0–149)
VLDL Cholesterol Cal: 27 mg/dL (ref 5–40)

## 2023-09-15 LAB — CBC
Hematocrit: 41.5 % (ref 34.0–46.6)
Hemoglobin: 13.7 g/dL (ref 11.1–15.9)
MCH: 29.3 pg (ref 26.6–33.0)
MCHC: 33 g/dL (ref 31.5–35.7)
MCV: 89 fL (ref 79–97)
Platelets: 247 x10E3/uL (ref 150–450)
RBC: 4.68 x10E6/uL (ref 3.77–5.28)
RDW: 12.4 % (ref 11.7–15.4)
WBC: 4.8 x10E3/uL (ref 3.4–10.8)

## 2023-09-15 LAB — INSULIN AND C-PEPTIDE, SERUM
C-Peptide: 4.8 ng/mL — ABNORMAL HIGH (ref 1.1–4.4)
INSULIN: 30.5 u[IU]/mL — ABNORMAL HIGH (ref 2.6–24.9)

## 2023-09-15 LAB — HEMOGLOBIN A1C
Est. average glucose Bld gHb Est-mCnc: 120 mg/dL
Hgb A1c MFr Bld: 5.8 % — ABNORMAL HIGH (ref 4.8–5.6)

## 2023-09-16 ENCOUNTER — Ambulatory Visit: Payer: Self-pay | Admitting: Family Medicine

## 2023-09-16 ENCOUNTER — Encounter: Payer: Self-pay | Admitting: Family Medicine

## 2023-10-16 DIAGNOSIS — R002 Palpitations: Secondary | ICD-10-CM

## 2023-10-26 ENCOUNTER — Other Ambulatory Visit: Payer: Self-pay | Admitting: Family Medicine

## 2023-10-27 ENCOUNTER — Other Ambulatory Visit: Payer: Self-pay | Admitting: Family Medicine

## 2023-11-08 ENCOUNTER — Encounter: Payer: Self-pay | Admitting: Family Medicine

## 2023-11-15 ENCOUNTER — Encounter: Payer: Self-pay | Admitting: Family Medicine

## 2023-11-15 ENCOUNTER — Ambulatory Visit: Admitting: Family Medicine

## 2023-11-15 VITALS — BP 134/87 | HR 97 | Ht 63.0 in

## 2023-11-15 DIAGNOSIS — R051 Acute cough: Secondary | ICD-10-CM

## 2023-11-15 DIAGNOSIS — E041 Nontoxic single thyroid nodule: Secondary | ICD-10-CM

## 2023-11-15 DIAGNOSIS — F418 Other specified anxiety disorders: Secondary | ICD-10-CM

## 2023-11-15 DIAGNOSIS — E049 Nontoxic goiter, unspecified: Secondary | ICD-10-CM

## 2023-11-15 MED ORDER — BENZONATATE 200 MG PO CAPS: 200.0000 mg | ORAL_CAPSULE | Freq: Two times a day (BID) | ORAL | 0 refills | Status: AC | PRN

## 2023-11-15 MED ORDER — ALPRAZOLAM 0.5 MG PO TABS: 0.5000 mg | ORAL_TABLET | Freq: Two times a day (BID) | ORAL | 0 refills | Status: AC | PRN

## 2023-11-15 NOTE — Progress Notes (Unsigned)
 Acute visit   Patient: Janice Gutierrez   DOB: [birth year]   53 y.o. Female  MRN: [id number] PCP: Myrla Jon HERO, MD   Chief Complaint  Patient presents with   Acute Visit    Patient would like to have orders placed to have an ultrasound of her throat completed. Patient reports she was seen at [facility] on 10/07 and the Dr. felt around on her throat and said have I ever had that scanned and she advised her about 15 years ago. Patient reported that over the past month or so, she'd felt like somethings still in her throat when she swallowed food or drink.  Provider then said it looked swollen and I really needed to have an ultrasound run on it.   Cough    Would like to see if she can have a rx for her cough. Reports hx of laryngitis. Would like rx of benzontate. Cough present X 1 week    Subjective    Discussed the use of AI scribe software for clinical note transcription with the patient, who gave verbal consent to proceed.  History of Present Illness   Janice Gutierrez is a 53 year old female with a history of enlarged goiter who presents with concerns about a neck mass.  She noticed a large nodule in her neck, initially identified during a visit to urgent care. The goiter appears larger than in the past. She recalls a previous thyroid ultrasound but is uncertain of the timing. She experiences frequent throat clearing and an occasional sensation of food getting stuck. There is no longstanding hoarseness, but she has experienced recent voice loss since Thursday. Over-the-counter medications and Tessalon Perles have been helpful for her cough. Recent thyroid function tests in August were normal. She has a history of anxiety and requests a refill of Xanax, recalling her previous dose as 0.5 mg.        Review of Systems  Objective    BP 134/87 (BP Location: Left Arm, Patient Position: Sitting, Cuff Size: Normal)   Pulse 97   Ht 5' 3 (1.6 m)   SpO2 98%   BMI 35.00 kg/m  Physical  Exam Vitals reviewed.  Constitutional:      General: She is not in acute distress.    Appearance: Normal appearance. She is well-developed. She is not diaphoretic.  HENT:     Head: Normocephalic and atraumatic.  Eyes:     General: No scleral icterus.    Conjunctiva/sclera: Conjunctivae normal.  Neck:     Thyroid: Thyromegaly (generalized enlargement, nontender, nodule palpable on R side) present.  Cardiovascular:     Rate and Rhythm: Normal rate and regular rhythm.     Heart sounds: Normal heart sounds. No murmur heard. Pulmonary:     Effort: Pulmonary effort is normal. No respiratory distress.     Breath sounds: Normal breath sounds. No wheezing, rhonchi or rales.  Musculoskeletal:     Cervical back: Neck supple.     Right lower leg: No edema.     Left lower leg: No edema.  Lymphadenopathy:     Cervical: No cervical adenopathy.  Skin:    General: Skin is warm and dry.     Findings: No rash.  Neurological:     Mental Status: She is alert and oriented to person, place, and time. Mental status is at baseline.  Psychiatric:        Mood and Affect: Mood normal.        Behavior:  Behavior normal.       Results for orders placed or performed in visit on 11/15/23  TSH  Result Value Ref Range   TSH 1.600 0.450 - 4.500 uIU/mL    Assessment & Plan     Problem List Items Addressed This Visit   None Visit Diagnoses       Enlarged thyroid    -  Primary   Relevant Orders   US  THYROID   TSH (Completed)     Thyroid nodule       Relevant Orders   US  THYROID   TSH (Completed)     Acute cough         Situational anxiety       Relevant Medications   ALPRAZolam (XANAX) 0.5 MG tablet           Enlarged thyroid with right thyroid nodule (nontoxic diffuse goiter and nodule) The thyroid gland is enlarged with a palpable nodule on the right side, appearing more significant than in previous examinations. Differential includes benign thyroid nodule versus thyroid cancer. Most  thyroid cancers are not aggressive and can be managed effectively with surgical intervention if necessary. - Order thyroid ultrasound at [facility] in [facility] - Order thyroid function tests - Consider referral to ENT or radiology for biopsy if ultrasound indicates a suspicious nodule  Acute cough and viral upper respiratory infection Cough and hoarseness since Thursday, likely due to a viral upper respiratory infection. Hoarseness is not attributed to the thyroid nodule. - Prescribe Tessalon Perles for cough relief  Anxiety disorder Anxiety disorder with request for alprazolam refill for situational use. She has not used Xanax in years but desires a new prescription for peace of mind. - Prescribe alprazolam 0.5 mg, 20 tablets, for situational use         Meds ordered this encounter  Medications   benzonatate (TESSALON) 200 MG capsule    Sig: Take 1 capsule (200 mg total) by mouth 2 (two) times daily as needed for cough.    Dispense:  20 capsule    Refill:  0   ALPRAZolam (XANAX) 0.5 MG tablet    Sig: Take 1 tablet (0.5 mg total) by mouth 2 (two) times daily as needed for anxiety.    Dispense:  20 tablet    Refill:  0     Return for as scheduled.      Jon Eva, MD  [facility] [phone] (phone) [phone] (fax)  [facility]

## 2023-11-16 ENCOUNTER — Encounter: Payer: Self-pay | Admitting: Family Medicine

## 2023-11-16 ENCOUNTER — Ambulatory Visit: Payer: Self-pay | Admitting: Family Medicine

## 2023-11-16 LAB — TSH: TSH: 1.6 u[IU]/mL (ref 0.450–4.500)

## 2023-11-17 MED ORDER — AMOXICILLIN-POT CLAVULANATE 875-125 MG PO TABS: 1.0000 | ORAL_TABLET | Freq: Two times a day (BID) | ORAL | 0 refills | Status: AC

## 2023-11-17 NOTE — Telephone Encounter (Unsigned)
 Copied from CRM #[id number]. Topic: Referral - Status >> Nov 17, 2023  9:36 AM Nathanel BROCKS wrote: Reason for CRM: pt called in this morning to follow up on the referral for an ultrasound for her throat. Please call pt and advise with an update.

## 2023-11-24 ENCOUNTER — Encounter: Payer: Self-pay | Admitting: Family Medicine

## 2023-11-24 DIAGNOSIS — E041 Nontoxic single thyroid nodule: Secondary | ICD-10-CM

## 2023-12-02 NOTE — Telephone Encounter (Signed)
 Copied from CRM [id number]. Topic: Clinical - Request for Lab/Test Order >> Dec 02, 2023  9:48 AM Marylynn H wrote: Reason for CRM:  Patient states she wanted to follow up with Dr. B regarding getting a needle aspiration done. Please reach out and advise # [phone]

## 2023-12-02 NOTE — Telephone Encounter (Signed)
 Will need to address with Dr. KATHEE on Monday. She may consider ordering a biopsy, or may recommend ENT referral. If we ordered a biopsy today it would still be a week or two to get it scheduled, so this really needs to be addressed by her PCP

## 2023-12-03 ENCOUNTER — Other Ambulatory Visit: Payer: Self-pay | Admitting: Family Medicine

## 2023-12-05 NOTE — Telephone Encounter (Signed)
 Requested Prescriptions  Pending Prescriptions Disp Refills   hydrochlorothiazide  (HYDRODIURIL ) 25 MG tablet [Pharmacy Med Name: HYDROCHLOROTHIAZIDE  25 MG TAB] 90 tablet 0    Sig: TAKE 1 TABLET (25 MG TOTAL) BY MOUTH DAILY.     Cardiovascular: Diuretics - Thiazide Passed - 12/05/2023  3:32 PM      Passed - Cr in normal range and within 180 days    Creatinine, Ser  Date Value Ref Range Status  09/12/2023 0.78 0.57 - 1.00 mg/dL Final         Passed - K in normal range and within 180 days    Potassium  Date Value Ref Range Status  09/12/2023 4.6 3.5 - 5.2 mmol/L Final         Passed - Na in normal range and within 180 days    Sodium  Date Value Ref Range Status  09/12/2023 140 134 - 144 mmol/L Final         Passed - Last BP in normal range    BP Readings from Last 1 Encounters:  11/15/23 134/87         Passed - Valid encounter within last 6 months    Recent Outpatient Visits           2 weeks ago Enlarged thyroid   [facility] [place], Jon HERO, MD   2 months ago Heart palpitations   [facility] [place], Jon HERO, MD   8 months ago Encounter for annual physical exam   [facility] [place], Jon HERO, MD       Future Appointments             In 3 months Bacigalupo, Jon HERO, MD [facility], [place]

## 2023-12-06 NOTE — Addendum Note (Signed)
 Addended by: LILIAN SEVERO RAMAN on: 12/06/2023 01:27 PM   Modules accepted: Orders

## 2023-12-06 NOTE — Telephone Encounter (Signed)
 Unfortunately, getting imaging at an outside facility does mean that we get the results slower.  Results are there are 2 nodules 1. Nodule labeled 2 in the right thyroid lobe (2.9 cm TR4) meets criteria for biopsy. 2. Nodule labeled 1 in the right thyroid lobe (1.1 cm TR4) meets criteria for follow-up ultrasound in one year, with this exam serving as a baseline.   Please send order for thyroid biopsy to [facility] (where she had US  done).

## 2023-12-06 NOTE — Addendum Note (Signed)
 Addended by: MYRLA JON HERO on: 12/06/2023 04:39 PM   Modules accepted: Orders

## 2023-12-07 ENCOUNTER — Other Ambulatory Visit: Payer: Self-pay | Admitting: Family Medicine

## 2023-12-07 ENCOUNTER — Inpatient Hospital Stay
Admission: RE | Admit: 2023-12-07 | Discharge: 2023-12-07 | Disposition: A | Discharge status: Home / Self Care | Payer: Self-pay | Source: Ambulatory Visit | Attending: Family Medicine | Admitting: Family Medicine

## 2023-12-07 DIAGNOSIS — E041 Nontoxic single thyroid nodule: Secondary | ICD-10-CM

## 2023-12-08 NOTE — Telephone Encounter (Signed)
 Please fax copy of the FNA biopsy order to [facility].

## 2023-12-09 NOTE — Progress Notes (Signed)
 Luverne Aran, MD sent to Carlie Clarita RAMAN [facility] to set up for FNA of 2.9 cm right thryoid nodule on outside US  at [facility].  May be a pseudonodule based on images.  GY

## 2023-12-13 NOTE — Progress Notes (Signed)
 Patient for US  guided FNA RT Inferior thyroid nodule biopsy on Wed 12/14/23, I called and spoke with the patient on the phone and gave pre-procedure instructions. Pt was made aware to be here at 2p and check in at the [facility] registration desk. Pt stated understanding.  Called   12/09/23

## 2023-12-14 ENCOUNTER — Ambulatory Visit
Admission: RE | Admit: 2023-12-14 | Discharge: 2023-12-14 | Disposition: A | Discharge status: Home / Self Care | Source: Ambulatory Visit | Attending: Family Medicine | Admitting: Family Medicine

## 2023-12-14 VITALS — BP 144/83 | HR 70

## 2023-12-14 DIAGNOSIS — E041 Nontoxic single thyroid nodule: Secondary | ICD-10-CM | POA: Diagnosis present

## 2023-12-14 MED ORDER — LIDOCAINE HCL (PF) 1 % IJ SOLN
10.0000 mL | Freq: Once | INTRAMUSCULAR | Status: AC
  Administered 2023-12-14: 10 mL

## 2023-12-15 ENCOUNTER — Ambulatory Visit: Payer: Self-pay | Admitting: Family Medicine

## 2023-12-17 ENCOUNTER — Other Ambulatory Visit: Payer: Self-pay | Admitting: Family Medicine

## 2023-12-17 DIAGNOSIS — K219 Gastro-esophageal reflux disease without esophagitis: Secondary | ICD-10-CM

## 2023-12-18 LAB — CYTOLOGY - NON PAP

## 2023-12-19 ENCOUNTER — Ambulatory Visit: Payer: Self-pay | Admitting: Family Medicine

## 2023-12-19 NOTE — Telephone Encounter (Signed)
 Noted

## 2024-01-20 ENCOUNTER — Telehealth

## 2024-01-21 ENCOUNTER — Telehealth

## 2024-01-21 ENCOUNTER — Other Ambulatory Visit: Payer: Self-pay | Admitting: Family Medicine

## 2024-01-21 DIAGNOSIS — R051 Acute cough: Secondary | ICD-10-CM | POA: Diagnosis not present

## 2024-01-21 DIAGNOSIS — J029 Acute pharyngitis, unspecified: Secondary | ICD-10-CM

## 2024-01-21 MED ORDER — LEVOFLOXACIN 500 MG PO TABS: 500.0000 mg | ORAL_TABLET | Freq: Every day | ORAL | 0 refills | Status: AC

## 2024-01-21 NOTE — Patient Instructions (Signed)

## 2024-01-21 NOTE — Progress Notes (Signed)
 " Virtual Visit Consent   [name], you are scheduled for a virtual visit with a [facility] provider today. Just as with appointments in the office, your consent must be obtained to participate. Your consent will be active for this visit and any virtual visit you may have with one of our providers in the next 365 days. If you have a MyChart account, a copy of this consent can be sent to you electronically.  As this is a virtual visit, video technology does not allow for your provider to perform a traditional examination. This may limit your provider's ability to fully assess your condition. If your provider identifies any concerns that need to be evaluated in person or the need to arrange testing (such as labs, EKG, etc.), we will make arrangements to do so. Although advances in technology are sophisticated, we cannot ensure that it will always work on either your end or our end. If the connection with a video visit is poor, the visit may have to be switched to a telephone visit. With either a video or telephone visit, we are not always able to ensure that we have a secure connection.  By engaging in this virtual visit, you consent to the provision of healthcare and authorize for your insurance to be billed (if applicable) for the services provided during this visit. Depending on your insurance coverage, you may receive a charge related to this service.  I need to obtain your verbal consent now. Are you willing to proceed with your visit today? Madalen Purdum has provided verbal consent on 01/21/2024 for a virtual visit (video or telephone). Loa Lamp, FNP  Date: 01/21/2024 9:07 AM   Virtual Visit via Video Note   I, Loa Lamp, connected with  Roseline Sanroman  ([id number], 10/26/1970) on 01/21/2024 at  9:00 AM EST by a video-enabled telemedicine application and verified that I am speaking with the correct person using two identifiers.  Location: Patient: Virtual Visit Location Patient:  Home Provider: Virtual Visit Location Provider: Home Office   I discussed the limitations of evaluation and management by telemedicine and the availability of in person appointments. The patient expressed understanding and agreed to proceed.    History of Present Illness: Janice Gutierrez is a 53 y.o. who identifies as a female who was assigned female at birth, and is being seen today for thick green mucus in throat. No fever. Sx off and on for 2-3 months. Nodules on thyroid  making her hoarse and planning to have them removed. Cough productive with green mucus. No wheezing or sob. Cough worse during the day.   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   Fibrocystic disease of breast 05/24/2018   GAD (generalized anxiety disorder) 05/24/2018   Hand arthritis 05/24/2018   Joint stiffness 08/31/2017   Prediabetes 08/30/2017   Essential hypertension 08/30/2017   Allergic rhinitis 08/30/2017   Family history of blood clots 08/30/2017   BMI 33.0-33.9,adult 08/27/2016   Prominent metatarsal head of left foot 06/10/2015   Mixed hyperlipidemia 02/21/2014   Gastroesophageal reflux disease without esophagitis 12/20/2013    Allergies: Allergies[1] Medications: Current Medications[2]  Observations/Objective: Patient is well-developed, well-nourished in no acute distress.  Resting comfortably  at home.  Head is normocephalic, atraumatic.  No labored breathing.  Speech is clear and coherent with logical content.  Patient is  alert and oriented at baseline.    Assessment and Plan: 1. Pharyngitis, unspecified etiology (Primary)  2. Acute cough  Increase fluids, humidifier at night, tylenol or ibuprofen as directed, uc as needed.   Follow Up Instructions: I discussed the assessment and treatment plan with the patient. The  patient was provided an opportunity to ask questions and all were answered. The patient agreed with the plan and demonstrated an understanding of the instructions.  A copy of instructions were sent to the patient via MyChart unless otherwise noted below.     The patient was advised to call back or seek an in-person evaluation if the symptoms worsen or if the condition fails to improve as anticipated.    Berlene Dixson, FNP     [1]  Allergies Allergen Reactions   Latex    Sulfur Nausea And Vomiting  [2]  Current Outpatient Medications:    ALPRAZolam  (XANAX ) 0.5 MG tablet, Take 1 tablet (0.5 mg total) by mouth 2 (two) times daily as needed for anxiety., Disp: 20 tablet, Rfl: 0   aspirin EC 81 MG tablet, Take 1 tablet by mouth daily., Disp: , Rfl:    benzonatate  (TESSALON ) 200 MG capsule, Take 1 capsule (200 mg total) by mouth 2 (two) times daily as needed for cough., Disp: 20 capsule, Rfl: 0   buPROPion  (WELLBUTRIN ) 75 MG tablet, TAKE 0.5 TABLETS (37.5 MG TOTAL) BY MOUTH DAILY., Disp: 45 tablet, Rfl: 0   clobetasol ointment (TEMOVATE) 0.05 %, Apply 1 Application topically 2 (two) times daily., Disp: , Rfl:    escitalopram  (LEXAPRO ) 10 MG tablet, TAKE 1/2 TABLET BY MOUTH DAILY, Disp: 45 tablet, Rfl: 1   hydrochlorothiazide  (HYDRODIURIL ) 25 MG tablet, TAKE 1 TABLET (25 MG TOTAL) BY MOUTH DAILY., Disp: 90 tablet, Rfl: 0   metoprolol  succinate (TOPROL -XL) 100 MG 24 hr tablet, TAKE 1 TABLET BY MOUTH EVERY DAY, Disp: 90 tablet, Rfl: 1   pantoprazole  (PROTONIX ) 20 MG tablet, TAKE 1 TABLET BY MOUTH EVERY DAY, Disp: 90 tablet, Rfl: 3  "

## 2024-01-25 ENCOUNTER — Other Ambulatory Visit: Payer: Self-pay | Admitting: Family Medicine

## 2024-01-27 ENCOUNTER — Encounter: Payer: Self-pay | Admitting: Family Medicine

## 2024-02-08 ENCOUNTER — Other Ambulatory Visit: Payer: Self-pay

## 2024-02-08 ENCOUNTER — Encounter: Payer: Self-pay | Admitting: Family Medicine

## 2024-02-08 DIAGNOSIS — E049 Nontoxic goiter, unspecified: Secondary | ICD-10-CM

## 2024-02-08 DIAGNOSIS — E041 Nontoxic single thyroid nodule: Secondary | ICD-10-CM

## 2024-03-08 ENCOUNTER — Other Ambulatory Visit: Payer: Self-pay | Admitting: Family Medicine

## 2024-03-20 ENCOUNTER — Encounter: Admitting: Family Medicine
